# Patient Record
Sex: Female | Born: 1960 | Race: White | Hispanic: No | Marital: Married | Smoking: Never smoker
Health system: Southern US, Community
[De-identification: ages and names within clinical notes are randomized; demographics above are authoritative.]

## PROBLEM LIST (undated history)

## (undated) DIAGNOSIS — K2970 Gastritis, unspecified, without bleeding: Secondary | ICD-10-CM

## (undated) DIAGNOSIS — I5189 Other ill-defined heart diseases: Secondary | ICD-10-CM

## (undated) DIAGNOSIS — R079 Chest pain, unspecified: Secondary | ICD-10-CM

## (undated) DIAGNOSIS — K449 Diaphragmatic hernia without obstruction or gangrene: Secondary | ICD-10-CM

## (undated) DIAGNOSIS — R519 Headache, unspecified: Secondary | ICD-10-CM

## (undated) DIAGNOSIS — I10 Essential (primary) hypertension: Secondary | ICD-10-CM

## (undated) DIAGNOSIS — K317 Polyp of stomach and duodenum: Secondary | ICD-10-CM

## (undated) DIAGNOSIS — N200 Calculus of kidney: Secondary | ICD-10-CM

## (undated) DIAGNOSIS — E785 Hyperlipidemia, unspecified: Secondary | ICD-10-CM

## (undated) DIAGNOSIS — Z87442 Personal history of urinary calculi: Secondary | ICD-10-CM

## (undated) HISTORY — DX: Gastritis, unspecified, without bleeding: K29.70

## (undated) HISTORY — DX: Polyp of stomach and duodenum: K31.7

## (undated) HISTORY — DX: Diaphragmatic hernia without obstruction or gangrene: K44.9

## (undated) HISTORY — DX: Other ill-defined heart diseases: I51.89

## (undated) HISTORY — DX: Chest pain, unspecified: R07.9

## (undated) HISTORY — DX: Essential (primary) hypertension: I10

## (undated) HISTORY — DX: Calculus of kidney: N20.0

## (undated) HISTORY — DX: Hyperlipidemia, unspecified: E78.5

## (undated) HISTORY — PX: FOOT SURGERY: SHX648

## (undated) HISTORY — PX: CARPAL TUNNEL RELEASE: SHX101

---

## 1988-01-09 HISTORY — PX: BACK SURGERY: SHX140

## 2007-01-07 DIAGNOSIS — T8859XA Other complications of anesthesia, initial encounter: Secondary | ICD-10-CM

## 2007-01-07 HISTORY — DX: Other complications of anesthesia, initial encounter: T88.59XA

## 2010-01-06 HISTORY — PX: CHOLECYSTECTOMY: SHX55

## 2018-10-12 ENCOUNTER — Other Ambulatory Visit: Payer: Self-pay | Admitting: Student

## 2018-10-12 DIAGNOSIS — M1711 Unilateral primary osteoarthritis, right knee: Secondary | ICD-10-CM

## 2018-10-22 ENCOUNTER — Ambulatory Visit: Payer: BC Managed Care – PPO

## 2018-10-26 ENCOUNTER — Encounter: Payer: Self-pay | Admitting: Cardiology

## 2018-11-04 ENCOUNTER — Ambulatory Visit: Payer: BC Managed Care – PPO

## 2018-11-15 ENCOUNTER — Ambulatory Visit
Admission: RE | Admit: 2018-11-15 | Discharge: 2018-11-15 | Disposition: A | Discharge status: Home / Self Care | Payer: BC Managed Care – PPO | Source: Ambulatory Visit | Attending: Student | Admitting: Student

## 2018-11-15 ENCOUNTER — Other Ambulatory Visit: Payer: Self-pay

## 2018-11-15 DIAGNOSIS — M1711 Unilateral primary osteoarthritis, right knee: Secondary | ICD-10-CM | POA: Diagnosis present

## 2019-02-18 ENCOUNTER — Encounter: Payer: Self-pay | Admitting: Cardiology

## 2019-02-18 ENCOUNTER — Other Ambulatory Visit: Payer: Self-pay

## 2019-02-18 ENCOUNTER — Ambulatory Visit (INDEPENDENT_AMBULATORY_CARE_PROVIDER_SITE_OTHER): Payer: BC Managed Care – PPO | Admitting: Cardiology

## 2019-02-18 VITALS — BP 150/94 | HR 101 | Ht 65.0 in | Wt 220.2 lb

## 2019-02-18 DIAGNOSIS — R072 Precordial pain: Secondary | ICD-10-CM | POA: Diagnosis not present

## 2019-02-18 DIAGNOSIS — R079 Chest pain, unspecified: Secondary | ICD-10-CM

## 2019-02-18 DIAGNOSIS — I10 Essential (primary) hypertension: Secondary | ICD-10-CM | POA: Diagnosis not present

## 2019-02-18 DIAGNOSIS — E7800 Pure hypercholesterolemia, unspecified: Secondary | ICD-10-CM

## 2019-02-18 MED ORDER — METOPROLOL TARTRATE 100 MG PO TABS: ORAL_TABLET | ORAL | 0 refills | Status: DC

## 2019-02-18 MED ORDER — LOSARTAN POTASSIUM 50 MG PO TABS: 50.0000 mg | ORAL_TABLET | Freq: Every day | ORAL | 5 refills | Status: DC

## 2019-02-18 NOTE — Patient Instructions (Addendum)
Medication Instructions:  Your physician has recommended you make the following change in your medication:   START Losartan 50 mg daily. An Rx has been sent to your pharmacy.  A one time dose of Metoprolol 100mg  to be taken 2 hours prior to your Cardiac CTA has been sent to your pharmacy.  *If you need a refill on your cardiac medications before your next appointment, please call your pharmacy*  Lab Work: You will need lab work (bmet) prior to your Cardiac CTA. Please take the written lab order with you to your pcp's office.  If you have labs (blood work) drawn today and your tests are completely normal, you will receive your results only by: MyChart Message (if you have MyChart) OR . A paper copy in the mail If you have any lab test that is abnormal or we need to change your treatment, we will call you to review the results.  Testing/Procedures: Your physician has requested that you have an echocardiogram. Echocardiography is a painless test that uses sound waves to create images of your heart. It provides your doctor with information about the size and shape of your heart and how well your heart's chambers and valves are working. This procedure takes approximately one hour. There are no restrictions for this procedure.  Your physician has requested that you have cardiac CT. Cardiac computed tomography (CT) is a painless test that uses an x-ray machine to take clear, detailed pictures of your heart. For further information please visit Marland Kitchen. Please follow instruction sheet as given.     Follow-Up: At [facility], you and your health needs are our priority.  As part of our continuing mission to provide you with exceptional heart care, we have created designated Provider Care Teams.  These Care Teams include your primary Cardiologist (physician) and Advanced Practice Providers (APPs -  Physician Assistants and Nurse Practitioners) who all work together to provide you with  the care you need, when you need it.  Your next appointment:   5-6 weeks   The format for your next appointment:   In Person  Provider:    You may see [facility], MD or one of the following Advanced Practice Providers on your designated Care Team:    Debbe Odea, NP  Nicolasa Ducking, PA-C  Eula Listen, PA-C   Other Instructions Your cardiac CT will be scheduled at one of the below locations:   [facility] [address] [place], [place] [phone]  OR  [facility] [address] [place], [place] [phone]  If scheduled at [facility], please arrive at the [facility] main entrance of [facility] 30 minutes prior to test start time. Proceed to the [facility] Radiology Department (first floor) to check-in and test prep.  If scheduled at [facility], please arrive 15 mins early for check-in and test prep.  Please follow these instructions carefully (unless otherwise directed):    On the Night Before the Test: . Be sure to Drink plenty of water. . Do not consume any caffeinated/decaffeinated beverages or chocolate 12 hours prior to your test. . Do not take any antihistamines 12 hours prior to your test.  On the Day of the Test: . Drink plenty of water. Do not drink any water within one hour of the test. . Do not eat any food 4 hours prior to the test. . You may take your regular medications prior  to the test.  . Take metoprolol (Lopressor) two hours prior to test. . FEMALES- please wear underwire-free bra if available      After the Test: . Drink plenty of water. . After receiving IV contrast, you may experience a mild flushed feeling. This is normal. . On occasion, you may experience a mild rash up to 24 hours after the test. This is not dangerous. If this occurs, you can take Benadryl 25 mg and increase your fluid  intake. . If you experience trouble breathing, this can be serious. If it is severe call 911 IMMEDIATELY. If it is mild, please call our office. . If you take any of these medications: Glipizide/Metformin, Avandament, Glucavance, please do not take 48 hours after completing test unless otherwise instructed.   Once we have confirmed authorization from your insurance company, we will call you to set up a date and time for your test.   For non-scheduling related questions, please contact the cardiac imaging nurse navigator should you have any questions/concerns: Marchia Bond, RN Navigator Cardiac Imaging [facility] [phone] mobile

## 2019-02-18 NOTE — Progress Notes (Signed)
Cardiology Office Note:    Date:  02/18/2019   ID:  Claire Miller, DOB [birth year], MRN [id number]  PCP:  Lorelei Pont, DO  Cardiologist:  Debbe Odea, MD  Electrophysiologist:  None   Referring MD: Lorelei Pont, DO   Chief Complaint  Patient presents with  . New Patient (Initial Visit)    Chest discomfort feels like a burning sensation and elevated BP. Meds verbally reviewed w/ patient.   Claire Miller is a 59 y.o. female who is being seen today for the evaluation of chest pain at the request of Lorelei Pont, DO.  History of Present Illness:    Claire Miller is a 59 y.o. female with a hx of hypertension, hyperlipidemia, GERD, osteoarthritis of the right knee who presents due to chest pain.  Patient states having symptoms of chest pain over the past several weeks.  She had a severe episode last evening on and off and did not go to sleep.  She describes symptoms as left-sided chest pressure, which she rates as a 4 out of 10 in severity.  Usually moving around helps with the discomfort but not this time.  Her symptoms of chest discomfort unrelated with exertion.  She also had another episode not as severe as last evening 3 days ago.  She was sitting down when symptoms happen.  She lives about an hour away in [place] and has family living in the area.  She was rushing to get to appointment today which might have caused her heart rate to increase.  She states having elevated blood pressures at home over the past couple of days when she checked.  Systolic is usually in the 140s to 150s.  She denies any history of heart disease, denies presyncope or syncope.  She had a prior episode of edema attributed to amlodipine.  This resolved after stopping amlodipine.  She has a history of GERD and states these symptoms are different compared to reflux symptoms.  Past Medical History:  Diagnosis Date  . Hyperlipidemia   . Hypertension   . Kidney stones     Past Surgical History:  Procedure  Laterality Date  . BACK SURGERY    . CARPAL TUNNEL RELEASE Left   . CHOLECYSTECTOMY    . FOOT SURGERY Bilateral     Current Medications: Current Meds  Medication Sig  . Ascorbic Acid (VITAMIN C PO) Take by mouth daily.  . ASPIRIN 81 PO 81 mg daily.   Marland Kitchen CALCIUM PO Take by mouth daily.  . diazepam (VALIUM) 5 MG tablet Take 5 mg by mouth 2 (two) times daily as needed.  Marland Kitchen ibuprofen (ADVIL) 600 MG tablet Take 600 mg by mouth as needed.   Marland Kitchen MAGNESIUM PO Take by mouth daily.   . meloxicam (MOBIC) 15 MG tablet Take 15 mg by mouth daily as needed.  . metoprolol succinate (TOPROL-XL) 100 MG 24 hr tablet daily.  . Multiple Vitamin (MULTIVITAMIN ADULT PO) daily.  . Omega-3 Fatty Acids (FISH OIL PO) Take by mouth daily.  . Omeprazole 20 MG TBEC 20 mg daily.   . Red Yeast Rice Extract 600 MG CAPS Take by mouth daily.  . rosuvastatin (CRESTOR) 5 MG tablet Take 5 mg by mouth daily.   Marland Kitchen tiZANidine (ZANAFLEX) 2 MG tablet Take 2 mg by mouth every 6 (six) hours as needed.  . TURMERIC PO Take by mouth daily.  Marland Kitchen VITAMIN D PO Take by mouth daily.      Allergies:   Sulfa antibiotics   Social  History   Socioeconomic History  . Marital status: Married    Spouse name: Not on file  . Number of children: Not on file  . Years of education: Not on file  . Highest education level: Not on file  Occupational History  . Not on file  Tobacco Use  . Smoking status: Never Smoker  . Smokeless tobacco: Never Used  Substance and Sexual Activity  . Alcohol use: Never  . Drug use: Never  . Sexual activity: Not on file  Other Topics Concern  . Not on file  Social History Narrative  . Not on file   Social Determinants of Health   Financial Resource Strain:   . Difficulty of Paying Living Expenses: Not on file  Food Insecurity:   . Worried About Programme researcher, broadcasting/film/video in the Last Year: Not on file  . Ran Out of Food in the Last Year: Not on file  Transportation Needs:   . Lack of Transportation (Medical):  Not on file  . Lack of Transportation (Non-Medical): Not on file  Physical Activity:   . Days of Exercise per Week: Not on file  . Minutes of Exercise per Session: Not on file  Stress:   . Feeling of Stress : Not on file  Social Connections:   . Frequency of Communication with Friends and Family: Not on file  . Frequency of Social Gatherings with Friends and Family: Not on file  . Attends Religious Services: Not on file  . Active Member of Clubs or Organizations: Not on file  . Attends Banker Meetings: Not on file  . Marital Status: Not on file     Family History: Atrial fibrillation in the mother.  ROS:   Please see the history of present illness.     All other systems reviewed and are negative.  EKGs/Labs/Other Studies Reviewed:    The following studies were reviewed today:   EKG:  EKG is  ordered today.  The ekg ordered today demonstrates sinus tachycardia, heart rate 101  Recent Labs: No results found for requested labs within last 8760 hours.  Recent Lipid Panel No results found for: CHOL, TRIG, HDL, CHOLHDL, VLDL, LDLCALC, LDLDIRECT  Physical Exam:    VS:  BP (!) 150/94 (BP Location: Right Arm, Patient Position: Sitting, Cuff Size: Large)   Pulse (!) 101   Ht 5\' 5"  (1.651 m)   Wt 220 lb 4 oz (99.9 kg)   SpO2 97%   BMI 36.65 kg/m     Wt Readings from Last 3 Encounters:  02/18/19 220 lb 4 oz (99.9 kg)     GEN:  Well nourished, well developed in no acute distress HEENT: Normal NECK: No JVD; No carotid bruits LYMPHATICS: No lymphadenopathy CARDIAC: RRR, no murmurs, rubs, gallops RESPIRATORY:  Clear to auscultation without rales, wheezing or rhonchi  ABDOMEN: Soft, non-tender, non-distended MUSCULOSKELETAL:  No edema; No deformity  SKIN: Warm and dry NEUROLOGIC:  Alert and oriented x 3 PSYCHIATRIC:  Normal affect   ASSESSMENT:    1. Chest pain of uncertain etiology   2. Essential hypertension   3. Pure hypercholesterolemia   4.  Precordial pain    PLAN:    In order of problems listed above:  1. Patient with persistent atypical chest pain.  She has risk factors of hypertension, hyperlipidemia, obesity.  Will evaluate patient with coronary CT angiogram, echocardiogram.  Also recommended patient increase omeprazole to 20 mg twice daily in case reflux is contributing even though patient  states the symptoms are different compared to her usual reflux symptoms. 2. Blood pressure is not controlled.  Start losartan 50 mg daily.  Continue Toprol-XL 100 mg daily. 3. Patient with history of hyperlipidemia.  Recent lipid panel performed at her primary care provider's office.  We will request these records.  Continue Crestor 5 mg daily.  Low up after echocardiogram, coronary CTA.  This note was generated in part or whole with voice recognition software. Voice recognition is usually quite accurate but there are transcription errors that can and very often do occur. I apologize for any typographical errors that were not detected and corrected.  Medication Adjustments/Labs and Tests Ordered: Current medicines are reviewed at length with the patient today.  Concerns regarding medicines are outlined above.  Orders Placed This Encounter  Procedures  . CT CORONARY MORPH W/CTA COR W/SCORE W/CA W/CM &/OR WO/CM  . CT CORONARY FRACTIONAL FLOW RESERVE DATA PREP  . CT CORONARY FRACTIONAL FLOW RESERVE FLUID ANALYSIS  . Basic metabolic panel  . EKG 12-Lead  . ECHOCARDIOGRAM COMPLETE   Meds ordered this encounter  Medications  . losartan (COZAAR) 50 MG tablet    Sig: Take 1 tablet (50 mg total) by mouth daily.    Dispense:  30 tablet    Refill:  5  . metoprolol tartrate (LOPRESSOR) 100 MG tablet    Sig: Take 1 tablet (100 mg) by mouth 2 hours prior to your Cardiac CTA    Dispense:  1 tablet    Refill:  0    Patient Instructions  Medication Instructions:  Your physician has recommended you make the following change in your  medication:   START Losartan 50 mg daily. An Rx has been sent to your pharmacy.  A one time dose of Metoprolol 100mg  to be taken 2 hours prior to your Cardiac CTA has been sent to your pharmacy.  *If you need a refill on your cardiac medications before your next appointment, please call your pharmacy*  Lab Work: You will need lab work (bmet) prior to your Cardiac CTA. Please take the written lab order with you to your pcp's office.  If you have labs (blood work) drawn today and your tests are completely normal, you will receive your results only by: Marland Kitchen MyChart Message (if you have MyChart) OR . A paper copy in the mail If you have any lab test that is abnormal or we need to change your treatment, we will call you to review the results.  Testing/Procedures: Your physician has requested that you have an echocardiogram. Echocardiography is a painless test that uses sound waves to create images of your heart. It provides your doctor with information about the size and shape of your heart and how well your heart's chambers and valves are working. This procedure takes approximately one hour. There are no restrictions for this procedure.  Your physician has requested that you have cardiac CT. Cardiac computed tomography (CT) is a painless test that uses an x-ray machine to take clear, detailed pictures of your heart. For further information please visit [email or web address]. Please follow instruction sheet as given.     Follow-Up: At [facility], you and your health needs are our priority.  As part of our continuing mission to provide you with exceptional heart care, we have created designated Provider Care Teams.  These Care Teams include your primary Cardiologist (physician) and Advanced Practice Providers (APPs -  Physician Assistants and Nurse Practitioners) who all work together to provide you with the  care you need, when you need it.  Your next appointment:   5-6 weeks   The format for  your next appointment:   In Person  Provider:    You may see Debbe Odea, MD or one of the following Advanced Practice Providers on your designated Care Team:    Nicolasa Ducking, NP  Eula Listen, PA-C  Marisue Ivan, PA-C   Other Instructions Your cardiac CT will be scheduled at one of the below locations:   [facility] [address] [place], [place] [address] [phone]  OR  [facility] [address] [place], [place] [address] [phone]  If scheduled at [facility], please arrive at the [facility] main entrance of [facility] 30 minutes prior to test start time. Proceed to the [facility] Radiology Department (first floor) to check-in and test prep.  If scheduled at [facility], please arrive 15 mins early for check-in and test prep.  Please follow these instructions carefully (unless otherwise directed):    On the Night Before the Test: . Be sure to Drink plenty of water. . Do not consume any caffeinated/decaffeinated beverages or chocolate 12 hours prior to your test. . Do not take any antihistamines 12 hours prior to your test.  On the Day of the Test: . Drink plenty of water. Do not drink any water within one hour of the test. . Do not eat any food 4 hours prior to the test. . You may take your regular medications prior to the test.  . Take metoprolol (Lopressor) two hours prior to test. . FEMALES- please wear underwire-free bra if available      After the Test: . Drink plenty of water. . After receiving IV contrast, you may experience a mild flushed feeling. This is normal. . On occasion, you may experience a mild rash up to 24 hours after the test. This is not dangerous. If this occurs, you can take Benadryl 25 mg and increase your fluid intake. . If you experience trouble breathing, this can be serious. If it is severe call 911 IMMEDIATELY.  If it is mild, please call our office. . If you take any of these medications: Glipizide/Metformin, Avandament, Glucavance, please do not take 48 hours after completing test unless otherwise instructed.   Once we have confirmed authorization from your insurance company, we will call you to set up a date and time for your test.   For non-scheduling related questions, please contact the cardiac imaging nurse navigator should you have any questions/concerns: Rockwell Alexandria, RN Navigator Cardiac Imaging [facility] [phone] mobile       Signed, Debbe Odea, MD  02/18/2019 1:04 PM    [facility]

## 2019-03-18 ENCOUNTER — Encounter: Payer: Self-pay | Admitting: Cardiology

## 2019-03-28 ENCOUNTER — Other Ambulatory Visit: Payer: Self-pay

## 2019-03-28 ENCOUNTER — Ambulatory Visit (INDEPENDENT_AMBULATORY_CARE_PROVIDER_SITE_OTHER): Payer: BC Managed Care – PPO

## 2019-03-28 DIAGNOSIS — R072 Precordial pain: Secondary | ICD-10-CM

## 2019-03-28 DIAGNOSIS — R079 Chest pain, unspecified: Secondary | ICD-10-CM | POA: Diagnosis not present

## 2019-03-30 ENCOUNTER — Encounter (HOSPITAL_COMMUNITY): Payer: Self-pay

## 2019-03-31 ENCOUNTER — Other Ambulatory Visit: Payer: Self-pay

## 2019-03-31 ENCOUNTER — Ambulatory Visit
Admission: RE | Admit: 2019-03-31 | Discharge: 2019-03-31 | Disposition: A | Discharge status: Home / Self Care | Payer: BC Managed Care – PPO | Source: Ambulatory Visit | Attending: Cardiology | Admitting: Cardiology

## 2019-03-31 DIAGNOSIS — R072 Precordial pain: Secondary | ICD-10-CM | POA: Insufficient documentation

## 2019-03-31 MED ORDER — IOHEXOL 350 MG/ML SOLN
125.0000 mL | Freq: Once | INTRAVENOUS | Status: AC | PRN
  Administered 2019-03-31: 125 mL via INTRAVENOUS

## 2019-03-31 MED ORDER — NITROGLYCERIN 0.4 MG SL SUBL
0.8000 mg | SUBLINGUAL_TABLET | Freq: Once | SUBLINGUAL | Status: AC
  Administered 2019-03-31: 0.8 mg via SUBLINGUAL

## 2019-03-31 MED ORDER — METOPROLOL TARTRATE 5 MG/5ML IV SOLN
5.0000 mg | INTRAVENOUS | Status: DC | PRN
  Administered 2019-03-31: 10:00:00 5 mg via INTRAVENOUS

## 2019-03-31 NOTE — Progress Notes (Signed)
Patient tolerated CT well. Drank water after. Ambulated to exit steady gait.  

## 2019-04-06 ENCOUNTER — Telehealth: Payer: Self-pay | Admitting: Cardiology

## 2019-04-06 NOTE — Telephone Encounter (Signed)
    COVID-19 Pre-Screening Questions:  . In the past 7 to 10 days have you had a cough,  shortness of breath, headache, congestion, fever (100 or greater) body aches, chills, sore throat, or sudden loss of taste or sense of smell? YES  . Have you been around anyone with known Covid 19. no . Have you been around anyone who is awaiting Covid 19 test results in the past 7 to 10 days?  No  . Have you been around anyone who has been exposed to Covid 19, or has mentioned symptoms of Covid 19 within the past 7 to 10 days? No   NEGATIVE TEST TODAY RUNNY NOSE SCRATCHY THROAT  If you have any concerns/questions about symptoms patients report during screening (either on the phone or at threshold). Contact the provider seeing the patient or DOD for further guidance.  If neither are available contact a member of the leadership team.

## 2019-04-06 NOTE — Telephone Encounter (Signed)
Patient had covid testing in [place] and she will bring the paper copy with her .

## 2019-04-07 ENCOUNTER — Ambulatory Visit (INDEPENDENT_AMBULATORY_CARE_PROVIDER_SITE_OTHER): Payer: BC Managed Care – PPO | Admitting: Cardiology

## 2019-04-07 ENCOUNTER — Other Ambulatory Visit: Payer: Self-pay

## 2019-04-07 ENCOUNTER — Encounter: Payer: Self-pay | Admitting: Cardiology

## 2019-04-07 VITALS — BP 130/88 | HR 97 | Ht 65.0 in | Wt 220.5 lb

## 2019-04-07 DIAGNOSIS — I10 Essential (primary) hypertension: Secondary | ICD-10-CM

## 2019-04-07 DIAGNOSIS — E7800 Pure hypercholesterolemia, unspecified: Secondary | ICD-10-CM

## 2019-04-07 DIAGNOSIS — R079 Chest pain, unspecified: Secondary | ICD-10-CM

## 2019-04-07 NOTE — Patient Instructions (Signed)
Medication Instructions:  Your physician recommends that you continue on your current medications as directed. Please refer to the Current Medication list given to you today.  *If you need a refill on your cardiac medications before your next appointment, please call your pharmacy*   Follow-Up: At [facility], you and your health needs are our priority.  As part of our continuing mission to provide you with exceptional heart care, we have created designated Provider Care Teams.  These Care Teams include your primary Cardiologist (physician) and Advanced Practice Providers (APPs -  Physician Assistants and Nurse Practitioners) who all work together to provide you with the care you need, when you need it.  We recommend signing up for the patient portal called "MyChart".  Sign up information is provided on this After Visit Summary.  MyChart is used to connect with patients for Virtual Visits (Telemedicine).  Patients are able to view lab/test results, encounter notes, upcoming appointments, etc.  Non-urgent messages can be sent to your provider as well.   To learn more about what you can do with MyChart, go to https://www.mychart.com.    Your next appointment:   12 month(s)  The format for your next appointment:   In Person  Provider:    You may see Brian Agbor-Etang, MD or one of the following Advanced Practice Providers on your designated Care Team:    Christopher Berge, NP  Ryan Dunn, PA-C  Jacquelyn Visser, PA-C  

## 2019-04-07 NOTE — Progress Notes (Signed)
Cardiology Office Note:    Date:  04/07/2019   ID:  Claire Miller, DOB Jan 04, 1961, MRN [id number]  PCP:  Lorelei Pont, DO  Cardiologist:  Debbe Odea, MD  Electrophysiologist:  None   Referring MD: Lorelei Pont, DO   Chief Complaint  Patient presents with  . Other    6 week follow up. Paitent states she feels much better. Meds reviewed verbally with patient.     History of Present Illness:    Claire Miller is a 59 y.o. female with a hx of hypertension, hyperlipidemia, GERD, osteoarthritis of the right knee who presents for follow-up.  She was last seen due to chest pain.  She described a left-sided chest pressure with 4 out of 10 in severity, not related with exertion.  Due to risk factors, coronary CTA was ordered.  Patient now presents for results.   Past Medical History:  Diagnosis Date  . Hyperlipidemia   . Hypertension   . Kidney stones     Past Surgical History:  Procedure Laterality Date  . BACK SURGERY    . CARPAL TUNNEL RELEASE Left   . CHOLECYSTECTOMY    . FOOT SURGERY Bilateral     Current Medications: Current Meds  Medication Sig  . Ascorbic Acid (VITAMIN C PO) Take by mouth daily.  . ASPIRIN 81 PO 81 mg daily.   Marland Kitchen CALCIUM PO Take by mouth daily.  . diazepam (VALIUM) 5 MG tablet Take 5 mg by mouth 2 (two) times daily as needed.  Marland Kitchen ibuprofen (ADVIL) 600 MG tablet Take 600 mg by mouth as needed.   Marland Kitchen losartan (COZAAR) 50 MG tablet Take 1 tablet (50 mg total) by mouth daily.  Marland Kitchen MAGNESIUM PO Take by mouth daily.   . meloxicam (MOBIC) 15 MG tablet Take 15 mg by mouth daily as needed.  . metoprolol succinate (TOPROL-XL) 100 MG 24 hr tablet daily.  . Multiple Vitamin (MULTIVITAMIN ADULT PO) daily.  . Omega-3 Fatty Acids (FISH OIL PO) Take by mouth daily.  . Omeprazole 20 MG TBEC 20 mg daily.   . Red Yeast Rice Extract 600 MG CAPS Take by mouth daily.  . rosuvastatin (CRESTOR) 5 MG tablet Take 5 mg by mouth daily.   Marland Kitchen tiZANidine (ZANAFLEX) 2 MG tablet  Take 2 mg by mouth every 6 (six) hours as needed.  . TURMERIC PO Take by mouth daily.  Marland Kitchen VITAMIN D PO Take by mouth daily.      Allergies:   Sulfa antibiotics   Social History   Socioeconomic History  . Marital status: Married    Spouse name: Not on file  . Number of children: Not on file  . Years of education: Not on file  . Highest education level: Not on file  Occupational History  . Not on file  Tobacco Use  . Smoking status: Never Smoker  . Smokeless tobacco: Never Used  Substance and Sexual Activity  . Alcohol use: Never  . Drug use: Never  . Sexual activity: Not on file  Other Topics Concern  . Not on file  Social History Narrative  . Not on file   Social Determinants of Health   Financial Resource Strain:   . Difficulty of Paying Living Expenses:   Food Insecurity:   . Worried About Programme researcher, broadcasting/film/video in the Last Year:   . Barista in the Last Year:   Transportation Needs:   . Freight forwarder (Medical):   Marland Kitchen Lack of Transportation (Non-Medical):  Physical Activity:   . Days of Exercise per Week:   . Minutes of Exercise per Session:   Stress:   . Feeling of Stress :   Social Connections:   . Frequency of Communication with Friends and Family:   . Frequency of Social Gatherings with Friends and Family:   . Attends Religious Services:   . Active Member of Clubs or Organizations:   . Attends Banker Meetings:   Marland Kitchen Marital Status:      Family History: Atrial fibrillation in the mother.  ROS:   Please see the history of present illness.     All other systems reviewed and are negative.  EKGs/Labs/Other Studies Reviewed:    The following studies were reviewed today:  EKG: EKG not obtained today  Recent Labs: No results found for requested labs within last 8760 hours.  Recent Lipid Panel No results found for: CHOL, TRIG, HDL, CHOLHDL, VLDL, LDLCALC, LDLDIRECT  Physical Exam:    VS:  BP 130/88 (BP Location: Left Arm,  Patient Position: Sitting, Cuff Size: Large)   Pulse 97   Ht 5\' 5"  (1.651 m)   Wt 220 lb 8 oz (100 kg)   SpO2 97%   BMI 36.69 kg/m     Wt Readings from Last 3 Encounters:  04/07/19 220 lb 8 oz (100 kg)  02/18/19 220 lb 4 oz (99.9 kg)     GEN:  Well nourished, well developed in no acute distress HEENT: Normal NECK: No JVD; No carotid bruits LYMPHATICS: No lymphadenopathy CARDIAC: RRR, no murmurs, rubs, gallops RESPIRATORY:  Clear to auscultation without rales, wheezing or rhonchi  ABDOMEN: Soft, non-tender, non-distended MUSCULOSKELETAL:  No edema; No deformity  SKIN: Warm and dry NEUROLOGIC:  Alert and oriented x 3 PSYCHIATRIC:  Normal affect   ASSESSMENT:    1. Chest pain of uncertain etiology   2. Essential hypertension   3. Pure hypercholesterolemia    PLAN:    In order of problems listed above:  1. Patient with persistent atypical chest pain.  She has risk factors of hypertension, hyperlipidemia, obesity.  Echocardiogram showed normal systolic function with impaired relaxation, ejection fraction 60 to 65%.  Coronary CTA showed a calcium score of 13.7, minimal calcification in the left main, nonobstructive CAD.  Patient reassured  2. blood pressure better controlled today.  Continue losartan 40 daily, Toprol-XL 10 mg daily. 3. History of hyperlipidemia.  Continue statin as prescribed  Follow-up in 1 year.  This note was generated in part or whole with voice recognition software. Voice recognition is usually quite accurate but there are transcription errors that can and very often do occur. I apologize for any typographical errors that were not detected and corrected.  Medication Adjustments/Labs and Tests Ordered: Current medicines are reviewed at length with the patient today.  Concerns regarding medicines are outlined above.  Orders Placed This Encounter  Procedures  . EKG 12-Lead   No orders of the defined types were placed in this encounter.   Patient  Instructions  Medication Instructions:  Your physician recommends that you continue on your current medications as directed. Please refer to the Current Medication list given to you today.  *If you need a refill on your cardiac medications before your next appointment, please call your pharmacy*   Follow-Up: At [facility], you and your health needs are our priority.  As part of our continuing mission to provide you with exceptional heart care, we have created designated Provider Care Teams.  These Care  Teams include your primary Cardiologist (physician) and Advanced Practice Providers (APPs -  Physician Assistants and Nurse Practitioners) who all work together to provide you with the care you need, when you need it.  We recommend signing up for the patient portal called "MyChart".  Sign up information is provided on this After Visit Summary.  MyChart is used to connect with patients for Virtual Visits (Telemedicine).  Patients are able to view lab/test results, encounter notes, upcoming appointments, etc.  Non-urgent messages can be sent to your provider as well.   To learn more about what you can do with MyChart, go to [email or web address].    Your next appointment:   12 month(s)  The format for your next appointment:   In Person  Provider:    You may see Kate Sable, MD or one of the following Advanced Practice Providers on your designated Care Team:    Murray Hodgkins, NP  Christell Faith, PA-C  Marrianne Mood, PA-C     Signed, Kate Sable, MD  04/07/2019 12:20 PM    [place]

## 2019-05-23 ENCOUNTER — Other Ambulatory Visit: Payer: Self-pay | Admitting: Surgery

## 2019-06-03 ENCOUNTER — Other Ambulatory Visit: Payer: Self-pay

## 2019-06-03 ENCOUNTER — Encounter
Admission: RE | Admit: 2019-06-03 | Discharge: 2019-06-03 | Disposition: A | Discharge status: Home / Self Care | Payer: BC Managed Care – PPO | Source: Ambulatory Visit | Attending: Surgery | Admitting: Surgery

## 2019-06-03 DIAGNOSIS — Z01812 Encounter for preprocedural laboratory examination: Secondary | ICD-10-CM | POA: Diagnosis present

## 2019-06-03 HISTORY — DX: Headache, unspecified: R51.9

## 2019-06-03 HISTORY — DX: Personal history of urinary calculi: Z87.442

## 2019-06-03 NOTE — Patient Instructions (Addendum)
Your procedure is scheduled on: Thursday June 16, 2019 Report to Day Surgery. To find out your arrival time please call [phone] between 1PM - 3PM on Wednesday June 15, 2019.  Remember: Instructions that are not followed completely may result in serious medical risk,  up to and including death, or upon the discretion of your surgeon and anesthesiologist your  surgery may need to be rescheduled.     _X__ 1. Do not eat food after midnight the night before your procedure.                 No gum chewing or hard candies. You may drink clear liquids up to 2 hours                 before you are scheduled to arrive for your surgery- DO not drink clear                 liquids within 2 hours of the start of your surgery.                 Clear Liquids include:  water, apple juice without pulp, clear Gatorade, G2 or                  Gatorade Zero (avoid Red/Purple/Blue), Black Coffee or Tea (Do not add                 anything to coffee or tea).  __X__2.   Complete the carbohydrate drink provided to you, 2 hours before arrival.  __X__3.  On the morning of surgery brush your teeth with toothpaste and water, you                may rinse your mouth with mouthwash if you wish.  Do not swallow any toothpaste of mouthwash.     _X__ 4.  No Alcohol for 24 hours before or after surgery.   _X__ 5.  Do Not Smoke or use e-cigarettes For 24 Hours Prior to Your Surgery.                 Do not use any chewable tobacco products for at least 6 hours prior to                 Surgery.  _X__  6.  Do not use any recreational drugs (marijuana, cocaine, heroin, ecstacy, MDMA or other)                For at least one week prior to your surgery.  Combination of these drugs with anesthesia                May have life threatening results.  __X__ 7.  Notify your doctor if there is any change in your medical condition      (cold, fever, infections).     Do not wear jewelry, make-up, hairpins,  clips or nail polish. Do not wear lotions, powders, or perfumes. You may wear deodorant. Do not shave 48 hours prior to surgery. Men may shave face and neck. Do not bring valuables to the hospital.    [facility] is not responsible for any belongings or valuables.  Contacts, dentures or bridgework may not be worn into surgery. Leave your suitcase in the car. After surgery it may be brought to your room. For patients admitted to the hospital, discharge time is determined by your treatment team.   Patients discharged the day of surgery will not be allowed to  drive home.   Make arrangements for someone to be with you for the first 24 hours of your Same Day Discharge.    Please read over the following fact sheets that you were given:   Total Joint Packet    __X__ Take these medicines the morning of surgery with A SIP OF WATER:    1. omeprazole (PRILOSEC) 20 MG   __X__ Use CHG Soap (or wipes) as directed   __X__ Stop aspirin on 06/06/19  __X__ Stop Anti-inflammatories such as Ibuprofen, Aleve, naproxen, and or BC powders 06/06/19   __X__ Stop supplements until after surgery.  TURMERIC,Red Yeast Rice Extract, FISH OIL, Biotin, VITAMIN C  __X__ Do not start any herbal supplements before your surgery.

## 2019-06-08 ENCOUNTER — Other Ambulatory Visit: Payer: Self-pay

## 2019-06-08 ENCOUNTER — Encounter
Admission: RE | Admit: 2019-06-08 | Discharge: 2019-06-08 | Disposition: A | Discharge status: Home / Self Care | Payer: BC Managed Care – PPO | Source: Ambulatory Visit | Attending: Surgery | Admitting: Surgery

## 2019-06-08 DIAGNOSIS — Z01812 Encounter for preprocedural laboratory examination: Secondary | ICD-10-CM | POA: Diagnosis present

## 2019-06-08 LAB — CBC WITH DIFFERENTIAL/PLATELET
Abs Immature Granulocytes: 0.01 10*3/uL (ref 0.00–0.07)
Basophils Absolute: 0 10*3/uL (ref 0.0–0.1)
Basophils Relative: 0 %
Eosinophils Absolute: 0.1 10*3/uL (ref 0.0–0.5)
Eosinophils Relative: 1 %
HCT: 41.1 % (ref 36.0–46.0)
Hemoglobin: 14.1 g/dL (ref 12.0–15.0)
Immature Granulocytes: 0 %
Lymphocytes Relative: 28 %
Lymphs Abs: 1.7 10*3/uL (ref 0.7–4.0)
MCH: 31.2 pg (ref 26.0–34.0)
MCHC: 34.3 g/dL (ref 30.0–36.0)
MCV: 90.9 fL (ref 80.0–100.0)
Monocytes Absolute: 0.4 10*3/uL (ref 0.1–1.0)
Monocytes Relative: 7 %
Neutro Abs: 3.9 10*3/uL (ref 1.7–7.7)
Neutrophils Relative %: 64 %
Platelets: 220 10*3/uL (ref 150–400)
RBC: 4.52 MIL/uL (ref 3.87–5.11)
RDW: 12.3 % (ref 11.5–15.5)
WBC: 6.1 10*3/uL (ref 4.0–10.5)
nRBC: 0 % (ref 0.0–0.2)

## 2019-06-08 LAB — URINALYSIS, ROUTINE W REFLEX MICROSCOPIC
Bilirubin Urine: NEGATIVE
Glucose, UA: NEGATIVE mg/dL
Hgb urine dipstick: NEGATIVE
Ketones, ur: NEGATIVE mg/dL
Leukocytes,Ua: NEGATIVE
Nitrite: NEGATIVE
Protein, ur: NEGATIVE mg/dL
Specific Gravity, Urine: 1.025 (ref 1.005–1.030)
pH: 5 (ref 5.0–8.0)

## 2019-06-08 LAB — COMPREHENSIVE METABOLIC PANEL
ALT: 23 U/L (ref 0–44)
AST: 21 U/L (ref 15–41)
Albumin: 4.4 g/dL (ref 3.5–5.0)
Alkaline Phosphatase: 43 U/L (ref 38–126)
Anion gap: 7 (ref 5–15)
BUN: 18 mg/dL (ref 6–20)
CO2: 26 mmol/L (ref 22–32)
Calcium: 9.3 mg/dL (ref 8.9–10.3)
Chloride: 108 mmol/L (ref 98–111)
Creatinine, Ser: 0.83 mg/dL (ref 0.44–1.00)
GFR calc Af Amer: >60 mL/min (ref >60)
GFR calc non Af Amer: >60 mL/min (ref >60)
Glucose, Bld: 95 mg/dL (ref 70–99)
Potassium: 4.3 mmol/L (ref 3.5–5.1)
Sodium: 141 mmol/L (ref 135–145)
Total Bilirubin: 0.8 mg/dL (ref 0.3–1.2)
Total Protein: 7.1 g/dL (ref 6.5–8.1)

## 2019-06-08 LAB — TYPE AND SCREEN
ABO/RH(D): A POS
Antibody Screen: NEGATIVE

## 2019-06-08 LAB — SURGICAL PCR SCREEN
MRSA, PCR: NEGATIVE
Staphylococcus aureus: NEGATIVE

## 2019-06-14 ENCOUNTER — Other Ambulatory Visit: Payer: Self-pay

## 2019-06-14 ENCOUNTER — Other Ambulatory Visit: Admission: RE | Admit: 2019-06-14 | Payer: BC Managed Care – PPO | Source: Ambulatory Visit

## 2019-06-14 ENCOUNTER — Encounter
Admission: RE | Admit: 2019-06-14 | Discharge: 2019-06-14 | Disposition: A | Discharge status: Home / Self Care | Payer: BC Managed Care – PPO | Source: Ambulatory Visit | Attending: Surgery | Admitting: Surgery

## 2019-06-14 DIAGNOSIS — Z20822 Contact with and (suspected) exposure to covid-19: Secondary | ICD-10-CM | POA: Insufficient documentation

## 2019-06-14 DIAGNOSIS — Z01818 Encounter for other preprocedural examination: Secondary | ICD-10-CM | POA: Diagnosis present

## 2019-06-15 LAB — SARS CORONAVIRUS 2 (TAT 6-24 HRS): SARS Coronavirus 2: NEGATIVE

## 2019-06-16 ENCOUNTER — Encounter: Payer: Self-pay | Admitting: Surgery

## 2019-06-16 ENCOUNTER — Ambulatory Visit: Payer: BC Managed Care – PPO | Admitting: Anesthesiology

## 2019-06-16 ENCOUNTER — Ambulatory Visit
Admission: RE | Admit: 2019-06-16 | Discharge: 2019-06-17 | Disposition: A | Discharge status: Home / Self Care | Payer: BC Managed Care – PPO | Source: Ambulatory Visit | Attending: Surgery | Admitting: Surgery

## 2019-06-16 ENCOUNTER — Ambulatory Visit: Payer: BC Managed Care – PPO

## 2019-06-16 ENCOUNTER — Other Ambulatory Visit: Payer: Self-pay

## 2019-06-16 ENCOUNTER — Encounter
Admission: RE | Disposition: A | Discharge status: Home / Self Care | Payer: Self-pay | Source: Ambulatory Visit | Attending: Surgery

## 2019-06-16 DIAGNOSIS — Z7982 Long term (current) use of aspirin: Secondary | ICD-10-CM | POA: Diagnosis not present

## 2019-06-16 DIAGNOSIS — Z882 Allergy status to sulfonamides status: Secondary | ICD-10-CM | POA: Diagnosis not present

## 2019-06-16 DIAGNOSIS — I10 Essential (primary) hypertension: Secondary | ICD-10-CM | POA: Diagnosis not present

## 2019-06-16 DIAGNOSIS — Z79899 Other long term (current) drug therapy: Secondary | ICD-10-CM | POA: Insufficient documentation

## 2019-06-16 DIAGNOSIS — E785 Hyperlipidemia, unspecified: Secondary | ICD-10-CM | POA: Diagnosis not present

## 2019-06-16 DIAGNOSIS — Z96651 Presence of right artificial knee joint: Secondary | ICD-10-CM

## 2019-06-16 DIAGNOSIS — K219 Gastro-esophageal reflux disease without esophagitis: Secondary | ICD-10-CM | POA: Insufficient documentation

## 2019-06-16 DIAGNOSIS — Z96652 Presence of left artificial knee joint: Secondary | ICD-10-CM

## 2019-06-16 DIAGNOSIS — M1711 Unilateral primary osteoarthritis, right knee: Secondary | ICD-10-CM | POA: Insufficient documentation

## 2019-06-16 HISTORY — PX: TOTAL KNEE ARTHROPLASTY: SHX125

## 2019-06-16 LAB — CREATININE, SERUM
Creatinine, Ser: 0.63 mg/dL (ref 0.44–1.00)
GFR calc Af Amer: >60 mL/min (ref >60)
GFR calc non Af Amer: >60 mL/min (ref >60)

## 2019-06-16 LAB — ABO/RH: ABO/RH(D): A POS

## 2019-06-16 SURGERY — ARTHROPLASTY, KNEE, TOTAL
Anesthesia: Spinal | Site: Knee | Laterality: Right

## 2019-06-16 MED ORDER — DEXAMETHASONE SODIUM PHOSPHATE 10 MG/ML IJ SOLN
INTRAMUSCULAR | Status: AC
  Filled 2019-06-16: qty 1

## 2019-06-16 MED ORDER — CEFAZOLIN SODIUM-DEXTROSE 2-4 GM/100ML-% IV SOLN
2.0000 g | Freq: Four times a day (QID) | INTRAVENOUS | Status: AC
  Administered 2019-06-16 – 2019-06-17 (×3): 2 g via INTRAVENOUS
  Filled 2019-06-16 (×3): qty 100

## 2019-06-16 MED ORDER — BISACODYL 10 MG RE SUPP
10.0000 mg | Freq: Every day | RECTAL | Status: DC | PRN
  Administered 2019-06-17: 10 mg via RECTAL
  Filled 2019-06-16: qty 1

## 2019-06-16 MED ORDER — TRANEXAMIC ACID 1000 MG/10ML IV SOLN
INTRAVENOUS | Status: AC
  Filled 2019-06-16: qty 10

## 2019-06-16 MED ORDER — OMEGA-3-ACID ETHYL ESTERS 1 G PO CAPS
1000.0000 mg | ORAL_CAPSULE | Freq: Every day | ORAL | Status: DC
  Administered 2019-06-16: 1000 mg via ORAL
  Filled 2019-06-16: qty 1

## 2019-06-16 MED ORDER — ACETAMINOPHEN 10 MG/ML IV SOLN
INTRAVENOUS | Status: DC | PRN
  Administered 2019-06-16: 1000 mg via INTRAVENOUS

## 2019-06-16 MED ORDER — CEFAZOLIN SODIUM-DEXTROSE 2-4 GM/100ML-% IV SOLN
2.0000 g | INTRAVENOUS | Status: AC
  Administered 2019-06-16: 2 g via INTRAVENOUS

## 2019-06-16 MED ORDER — METOCLOPRAMIDE HCL 5 MG/ML IJ SOLN: 5.0000 mg | Freq: Three times a day (TID) | INTRAMUSCULAR | Status: DC | PRN

## 2019-06-16 MED ORDER — DOCUSATE SODIUM 100 MG PO CAPS
100.0000 mg | ORAL_CAPSULE | Freq: Two times a day (BID) | ORAL | Status: DC
  Administered 2019-06-16 – 2019-06-17 (×2): 100 mg via ORAL
  Filled 2019-06-16 (×2): qty 1

## 2019-06-16 MED ORDER — TRANEXAMIC ACID 1000 MG/10ML IV SOLN
INTRAVENOUS | Status: DC | PRN
  Administered 2019-06-16: 1000 mg via TOPICAL

## 2019-06-16 MED ORDER — DIPHENHYDRAMINE HCL 12.5 MG/5ML PO ELIX: 12.5000 mg | ORAL_SOLUTION | ORAL | Status: DC | PRN

## 2019-06-16 MED ORDER — KETOROLAC TROMETHAMINE 15 MG/ML IJ SOLN
15.0000 mg | Freq: Four times a day (QID) | INTRAMUSCULAR | Status: AC
  Administered 2019-06-16 – 2019-06-17 (×4): 15 mg via INTRAVENOUS
  Filled 2019-06-16 (×4): qty 1

## 2019-06-16 MED ORDER — TRAMADOL HCL 50 MG PO TABS
50.0000 mg | ORAL_TABLET | Freq: Four times a day (QID) | ORAL | Status: DC | PRN
  Administered 2019-06-17: 50 mg via ORAL
  Filled 2019-06-16: qty 1

## 2019-06-16 MED ORDER — DEXAMETHASONE SODIUM PHOSPHATE 10 MG/ML IJ SOLN
INTRAMUSCULAR | Status: DC | PRN
  Administered 2019-06-16: 5 mg via INTRAVENOUS

## 2019-06-16 MED ORDER — CEFAZOLIN SODIUM-DEXTROSE 2-4 GM/100ML-% IV SOLN
INTRAVENOUS | Status: AC
  Filled 2019-06-16: qty 100

## 2019-06-16 MED ORDER — MEPERIDINE HCL 50 MG/ML IJ SOLN: 6.2500 mg | INTRAMUSCULAR | Status: DC | PRN

## 2019-06-16 MED ORDER — LOSARTAN POTASSIUM 50 MG PO TABS
50.0000 mg | ORAL_TABLET | Freq: Every day | ORAL | Status: DC
  Administered 2019-06-16: 50 mg via ORAL
  Filled 2019-06-16: qty 1

## 2019-06-16 MED ORDER — FENTANYL CITRATE (PF) 100 MCG/2ML IJ SOLN
INTRAMUSCULAR | Status: AC
  Filled 2019-06-16: qty 2

## 2019-06-16 MED ORDER — PANTOPRAZOLE SODIUM 40 MG PO TBEC
40.0000 mg | DELAYED_RELEASE_TABLET | Freq: Every day | ORAL | Status: DC
  Administered 2019-06-17: 40 mg via ORAL
  Filled 2019-06-16: qty 1

## 2019-06-16 MED ORDER — ACETAMINOPHEN 325 MG PO TABS
325.0000 mg | ORAL_TABLET | Freq: Four times a day (QID) | ORAL | Status: DC | PRN
  Administered 2019-06-17: 650 mg via ORAL
  Filled 2019-06-16: qty 2

## 2019-06-16 MED ORDER — ONDANSETRON HCL 4 MG/2ML IJ SOLN
INTRAMUSCULAR | Status: DC | PRN
  Administered 2019-06-16: 4 mg via INTRAVENOUS

## 2019-06-16 MED ORDER — ACETAMINOPHEN 325 MG PO TABS: 325.0000 mg | ORAL_TABLET | ORAL | Status: DC | PRN

## 2019-06-16 MED ORDER — BUPIVACAINE HCL (PF) 0.5 % IJ SOLN
INTRAMUSCULAR | Status: AC
  Filled 2019-06-16: qty 10

## 2019-06-16 MED ORDER — OXYCODONE HCL 5 MG PO TABS
5.0000 mg | ORAL_TABLET | ORAL | Status: DC | PRN
  Administered 2019-06-16 – 2019-06-17 (×4): 10 mg via ORAL
  Administered 2019-06-17: 5 mg via ORAL
  Filled 2019-06-16 (×4): qty 2
  Filled 2019-06-16: qty 1

## 2019-06-16 MED ORDER — ONDANSETRON HCL 4 MG/2ML IJ SOLN: 4.0000 mg | Freq: Four times a day (QID) | INTRAMUSCULAR | Status: DC | PRN

## 2019-06-16 MED ORDER — PROPOFOL 10 MG/ML IV BOLUS
INTRAVENOUS | Status: AC
  Filled 2019-06-16: qty 20

## 2019-06-16 MED ORDER — BUPIVACAINE HCL (PF) 0.5 % IJ SOLN
INTRAMUSCULAR | Status: DC | PRN
  Administered 2019-06-16: 2.8 mL

## 2019-06-16 MED ORDER — HYDROCODONE-ACETAMINOPHEN 7.5-325 MG PO TABS: 1.0000 | ORAL_TABLET | Freq: Once | ORAL | Status: DC | PRN

## 2019-06-16 MED ORDER — ROSUVASTATIN CALCIUM 5 MG PO TABS
5.0000 mg | ORAL_TABLET | Freq: Every day | ORAL | Status: DC
  Administered 2019-06-16: 5 mg via ORAL
  Filled 2019-06-16: qty 1

## 2019-06-16 MED ORDER — FENTANYL CITRATE (PF) 100 MCG/2ML IJ SOLN
INTRAMUSCULAR | Status: DC | PRN
  Administered 2019-06-16 (×2): 50 ug via INTRAVENOUS

## 2019-06-16 MED ORDER — RED YEAST RICE EXTRACT 600 MG PO CAPS: 600.0000 mg | ORAL_CAPSULE | Freq: Every day | ORAL | Status: DC

## 2019-06-16 MED ORDER — MIDAZOLAM HCL 2 MG/2ML IJ SOLN
INTRAMUSCULAR | Status: DC | PRN
  Administered 2019-06-16 (×2): 2 mg via INTRAVENOUS

## 2019-06-16 MED ORDER — ACETAMINOPHEN 160 MG/5ML PO SOLN
325.0000 mg | ORAL | Status: DC | PRN
  Filled 2019-06-16: qty 20.3

## 2019-06-16 MED ORDER — HYDROMORPHONE HCL 1 MG/ML IJ SOLN
0.2500 mg | INTRAMUSCULAR | Status: DC | PRN
  Administered 2019-06-16: 0.5 mg via INTRAVENOUS
  Filled 2019-06-16: qty 1

## 2019-06-16 MED ORDER — BUPIVACAINE LIPOSOME 1.3 % IJ SUSP
INTRAMUSCULAR | Status: AC
  Filled 2019-06-16: qty 20

## 2019-06-16 MED ORDER — PROPOFOL 500 MG/50ML IV EMUL
INTRAVENOUS | Status: AC
  Filled 2019-06-16: qty 50

## 2019-06-16 MED ORDER — ONDANSETRON HCL 4 MG PO TABS
4.0000 mg | ORAL_TABLET | Freq: Four times a day (QID) | ORAL | Status: DC | PRN
  Administered 2019-06-16: 4 mg via ORAL
  Filled 2019-06-16: qty 1

## 2019-06-16 MED ORDER — MIDAZOLAM HCL 2 MG/2ML IJ SOLN
INTRAMUSCULAR | Status: AC
  Filled 2019-06-16: qty 2

## 2019-06-16 MED ORDER — BIOTIN 5000 MCG PO TABS: ORAL_TABLET | Freq: Every day | ORAL | Status: DC

## 2019-06-16 MED ORDER — VITAMIN D 25 MCG (1000 UNIT) PO TABS
1000.0000 [IU] | ORAL_TABLET | Freq: Every day | ORAL | Status: DC
  Administered 2019-06-16: 1000 [IU] via ORAL
  Filled 2019-06-16: qty 1

## 2019-06-16 MED ORDER — ASPIRIN EC 81 MG PO TBEC
81.0000 mg | DELAYED_RELEASE_TABLET | Freq: Every day | ORAL | Status: DC
  Administered 2019-06-16: 81 mg via ORAL
  Filled 2019-06-16: qty 1

## 2019-06-16 MED ORDER — SODIUM CHLORIDE 0.9 % IV SOLN
INTRAVENOUS | Status: DC | PRN
  Administered 2019-06-16: 60 mL

## 2019-06-16 MED ORDER — ORAL CARE MOUTH RINSE: 15.0000 mL | Freq: Once | OROMUCOSAL | Status: AC

## 2019-06-16 MED ORDER — PROPOFOL 500 MG/50ML IV EMUL
INTRAVENOUS | Status: DC | PRN
  Administered 2019-06-16: 100 ug/kg/min via INTRAVENOUS

## 2019-06-16 MED ORDER — ENOXAPARIN SODIUM 40 MG/0.4ML ~~LOC~~ SOLN
40.0000 mg | SUBCUTANEOUS | Status: DC
  Administered 2019-06-17: 40 mg via SUBCUTANEOUS
  Filled 2019-06-16: qty 0.4

## 2019-06-16 MED ORDER — KETOROLAC TROMETHAMINE 30 MG/ML IJ SOLN: 30.0000 mg | Freq: Once | INTRAMUSCULAR | Status: AC

## 2019-06-16 MED ORDER — ACETAMINOPHEN 500 MG PO TABS
1000.0000 mg | ORAL_TABLET | Freq: Four times a day (QID) | ORAL | Status: AC
  Administered 2019-06-16 (×3): 1000 mg via ORAL
  Filled 2019-06-16 (×4): qty 2

## 2019-06-16 MED ORDER — CHLORHEXIDINE GLUCONATE 0.12 % MT SOLN
OROMUCOSAL | Status: AC
  Administered 2019-06-16: 15 mL via OROMUCOSAL
  Filled 2019-06-16: qty 15

## 2019-06-16 MED ORDER — PROMETHAZINE HCL 25 MG/ML IJ SOLN: 6.2500 mg | INTRAMUSCULAR | Status: DC | PRN

## 2019-06-16 MED ORDER — SODIUM CHLORIDE (PF) 0.9 % IJ SOLN
INTRAMUSCULAR | Status: AC
  Filled 2019-06-16: qty 50

## 2019-06-16 MED ORDER — CALCIUM CARBONATE-VITAMIN D 500-200 MG-UNIT PO TABS
ORAL_TABLET | Freq: Every day | ORAL | Status: DC
  Administered 2019-06-16: 1 via ORAL
  Filled 2019-06-16: qty 1

## 2019-06-16 MED ORDER — PHENYLEPHRINE HCL-NACL 20-0.9 MG/250ML-% IV SOLN
INTRAVENOUS | Status: DC | PRN
  Administered 2019-06-16: 50 ug/min via INTRAVENOUS

## 2019-06-16 MED ORDER — KETOROLAC TROMETHAMINE 30 MG/ML IJ SOLN
INTRAMUSCULAR | Status: AC
  Administered 2019-06-16: 30 mg via INTRAVENOUS
  Filled 2019-06-16: qty 1

## 2019-06-16 MED ORDER — TURMERIC 500 MG PO TABS: 1650.0000 mg | ORAL_TABLET | Freq: Every day | ORAL | Status: DC

## 2019-06-16 MED ORDER — MAGNESIUM OXIDE 400 (241.3 MG) MG PO TABS
400.0000 mg | ORAL_TABLET | Freq: Every day | ORAL | Status: DC
  Administered 2019-06-16: 400 mg via ORAL
  Filled 2019-06-16: qty 1

## 2019-06-16 MED ORDER — FLEET ENEMA 7-19 GM/118ML RE ENEM: 1.0000 | ENEMA | Freq: Once | RECTAL | Status: DC | PRN

## 2019-06-16 MED ORDER — METOCLOPRAMIDE HCL 10 MG PO TABS: 5.0000 mg | ORAL_TABLET | Freq: Three times a day (TID) | ORAL | Status: DC | PRN

## 2019-06-16 MED ORDER — PHENYLEPHRINE HCL (PRESSORS) 10 MG/ML IV SOLN
INTRAVENOUS | Status: AC
  Filled 2019-06-16: qty 1

## 2019-06-16 MED ORDER — ACETAMINOPHEN 10 MG/ML IV SOLN
INTRAVENOUS | Status: AC
  Filled 2019-06-16: qty 100

## 2019-06-16 MED ORDER — FENTANYL CITRATE (PF) 100 MCG/2ML IJ SOLN: 25.0000 ug | INTRAMUSCULAR | Status: DC | PRN

## 2019-06-16 MED ORDER — BUPIVACAINE-EPINEPHRINE (PF) 0.5% -1:200000 IJ SOLN
INTRAMUSCULAR | Status: AC
  Filled 2019-06-16: qty 30

## 2019-06-16 MED ORDER — ASCORBIC ACID 500 MG PO TABS
250.0000 mg | ORAL_TABLET | Freq: Every day | ORAL | Status: DC
  Administered 2019-06-16: 250 mg via ORAL
  Filled 2019-06-16: qty 1

## 2019-06-16 MED ORDER — LACTATED RINGERS IV SOLN: INTRAVENOUS | Status: DC

## 2019-06-16 MED ORDER — EPHEDRINE 5 MG/ML INJ
INTRAVENOUS | Status: AC
  Filled 2019-06-16: qty 10

## 2019-06-16 MED ORDER — ADULT MULTIVITAMIN W/MINERALS CH
1.0000 | ORAL_TABLET | Freq: Every day | ORAL | Status: DC
  Administered 2019-06-16: 1 via ORAL
  Filled 2019-06-16: qty 1

## 2019-06-16 MED ORDER — ONDANSETRON HCL 4 MG/2ML IJ SOLN
INTRAMUSCULAR | Status: AC
  Filled 2019-06-16: qty 2

## 2019-06-16 MED ORDER — BUPIVACAINE-EPINEPHRINE (PF) 0.5% -1:200000 IJ SOLN
INTRAMUSCULAR | Status: DC | PRN
  Administered 2019-06-16: 30 mL

## 2019-06-16 MED ORDER — METOPROLOL SUCCINATE ER 25 MG PO TB24
100.0000 mg | ORAL_TABLET | Freq: Every day | ORAL | Status: DC
  Administered 2019-06-16: 100 mg via ORAL
  Filled 2019-06-16: qty 4

## 2019-06-16 MED ORDER — MAGNESIUM HYDROXIDE 400 MG/5ML PO SUSP
30.0000 mL | Freq: Every day | ORAL | Status: DC | PRN
  Administered 2019-06-17: 30 mL via ORAL
  Filled 2019-06-16: qty 30

## 2019-06-16 MED ORDER — SODIUM CHLORIDE 0.9 % IV SOLN: INTRAVENOUS | Status: DC

## 2019-06-16 MED ORDER — CHLORHEXIDINE GLUCONATE 0.12 % MT SOLN: 15.0000 mL | Freq: Once | OROMUCOSAL | Status: AC

## 2019-06-16 SURGICAL SUPPLY — 59 items
BLADE SAW SAG 25X90X1.19 (BLADE) ×3 IMPLANT
BLADE SURG SZ20 CARB STEEL (BLADE) ×3 IMPLANT
BNDG ELASTIC 6X5.8 VLCR NS LF (GAUZE/BANDAGES/DRESSINGS) ×3 IMPLANT
CANISTER SUCT 1200ML W/VALVE (MISCELLANEOUS) ×3 IMPLANT
CANISTER SUCT 3000ML PPV (MISCELLANEOUS) ×3 IMPLANT
CEMENT BONE R 1X40 (Cement) ×6 IMPLANT
CEMENT VACUUM MIXING SYSTEM (MISCELLANEOUS) ×3 IMPLANT
CHLORAPREP W/TINT 26 (MISCELLANEOUS) ×3 IMPLANT
COOLER POLAR GLACIER W/PUMP (MISCELLANEOUS) ×3 IMPLANT
COVER MAYO STAND REUSABLE (DRAPES) ×3 IMPLANT
COVER WAND RF STERILE (DRAPES) ×3 IMPLANT
CUFF TOURN SGL QUICK 24 (TOURNIQUET CUFF)
CUFF TOURN SGL QUICK 30 (TOURNIQUET CUFF) ×2
CUFF TRNQT CYL 24X4X16.5-23 (TOURNIQUET CUFF) IMPLANT
CUFF TRNQT CYL 30X4X21-28X (TOURNIQUET CUFF) ×1 IMPLANT
DRAPE 3/4 80X56 (DRAPES) ×3 IMPLANT
DRAPE IMP U-DRAPE 54X76 (DRAPES) ×3 IMPLANT
DRSG OPSITE POSTOP 4X10 (GAUZE/BANDAGES/DRESSINGS) ×3 IMPLANT
DRSG OPSITE POSTOP 4X8 (GAUZE/BANDAGES/DRESSINGS) ×3 IMPLANT
ELECT CAUTERY BLADE 6.4 (BLADE) ×3 IMPLANT
ELECT REM PT RETURN 9FT ADLT (ELECTROSURGICAL) ×3
ELECTRODE REM PT RTRN 9FT ADLT (ELECTROSURGICAL) ×1 IMPLANT
FEMORAL CR RIGHT 67.5MM (Joint) ×3 IMPLANT
GLOVE BIO SURGEON STRL SZ7.5 (GLOVE) ×18 IMPLANT
GLOVE BIO SURGEON STRL SZ8 (GLOVE) ×12 IMPLANT
GLOVE BIOGEL PI IND STRL 8 (GLOVE) ×1 IMPLANT
GLOVE BIOGEL PI INDICATOR 8 (GLOVE) ×2
GLOVE INDICATOR 8.0 STRL GRN (GLOVE) ×3 IMPLANT
GOWN STRL REUS W/ TWL LRG LVL3 (GOWN DISPOSABLE) ×1 IMPLANT
GOWN STRL REUS W/ TWL XL LVL3 (GOWN DISPOSABLE) ×1 IMPLANT
GOWN STRL REUS W/TWL LRG LVL3 (GOWN DISPOSABLE) ×2
GOWN STRL REUS W/TWL XL LVL3 (GOWN DISPOSABLE) ×2
HOLDER FOLEY CATH W/STRAP (MISCELLANEOUS) IMPLANT
HOOD PEEL AWAY FLYTE STAYCOOL (MISCELLANEOUS) ×15 IMPLANT
INSERT TIB BEARING 71 10 (Insert) ×3 IMPLANT
KIT TURNOVER KIT A (KITS) ×3 IMPLANT
NDL SAFETY ECLIPSE 18X1.5 (NEEDLE) ×2 IMPLANT
NEEDLE HYPO 18GX1.5 SHARP (NEEDLE) ×4
NEEDLE SPNL 20GX3.5 QUINCKE YW (NEEDLE) ×3 IMPLANT
NS IRRIG 1000ML POUR BTL (IV SOLUTION) ×3 IMPLANT
PACK TOTAL KNEE (MISCELLANEOUS) ×3 IMPLANT
PAD WRAPON POLAR KNEE (MISCELLANEOUS) ×1 IMPLANT
PATELLA STD 34X8.5 (Orthopedic Implant) ×3 IMPLANT
PLATE KNEE TIBIAL 71MM FIXED (Plate) ×3 IMPLANT
PULSAVAC PLUS IRRIG FAN TIP (DISPOSABLE) ×3
SOL .9 NS 3000ML IRR  AL (IV SOLUTION) ×2
SOL .9 NS 3000ML IRR UROMATIC (IV SOLUTION) ×1 IMPLANT
STAPLER SKIN PROX 35W (STAPLE) ×3 IMPLANT
SUCTION FRAZIER HANDLE 10FR (MISCELLANEOUS) ×2
SUCTION TUBE FRAZIER 10FR DISP (MISCELLANEOUS) ×1 IMPLANT
SUT VIC AB 0 CT1 36 (SUTURE) ×9 IMPLANT
SUT VIC AB 2-0 CT1 27 (SUTURE) ×6
SUT VIC AB 2-0 CT1 TAPERPNT 27 (SUTURE) ×3 IMPLANT
SYR 10ML LL (SYRINGE) ×3 IMPLANT
SYR 20ML LL LF (SYRINGE) ×3 IMPLANT
SYR 30ML LL (SYRINGE) ×9 IMPLANT
TIP FAN IRRIG PULSAVAC PLUS (DISPOSABLE) ×1 IMPLANT
TRAY FOLEY MTR SLVR 16FR STAT (SET/KITS/TRAYS/PACK) IMPLANT
WRAPON POLAR PAD KNEE (MISCELLANEOUS) ×3

## 2019-06-16 NOTE — H&P (Signed)
History of Present Illness: Claire Miller is a 59 y.o. female who presents today for her surgical history and physical for upcoming right total knee arthroplasty. Surgery was scheduled with Dr. Roland Rack on 06/16/2019. Patient reports a 2 out of 10 pain score to the right knee at this time. She denies any falls or trauma affecting the right knee since her last evaluation. The patient denies any numbness or tingling to the right lower extremity. She denies any personal history of heart attack, stroke, asthma or COPD. No personal history of blood clots.  Past Medical History: . Hypertension   Past Surgical History: . BACK SURGERY  . foot surgery  bilateral  . gall bladder removal  . Left carpal tunnel release   Past Family History: . Breast cancer Mother   Medications: . acetaminophen (TYLENOL) 500 MG tablet Take 500-1,000 mg by mouth every 6 (six) hours as needed  . aspirin 81 MG EC tablet Take 1 tablet by mouth once daily  . biotin 5 mg Tab Take 5,000 mg by mouth once daily  . cholecalciferol (VITAMIN D3) 1000 unit tablet Take 1,000 Units by mouth nightly  . docosahexaenoic acid/epa (FISH OIL ORAL) Take 1,000 mg by mouth once daily  . ibuprofen (MOTRIN) 200 MG tablet Take 400 mg by mouth every 8 (eight) hours as needed  . magnesium 250 mg Tab Take 250 mg by mouth once daily  . menthol Gel Apply 1 Application topically once daily as needed  . metoprolol succinate (TOPROL-XL) 100 MG XL tablet Take 100 mg by mouth once daily  . multivitamin (MULTIPLE VITAMIN ORAL) Take 1 tablet by mouth nightly  . multivitamin capsule Take 1 capsule by mouth once daily  . omeprazole (PRILOSEC) 20 MG DR capsule Take 20 mg by mouth once daily  . red yeast rice 600 mg Cap Take 600 mg by mouth once daily  . rosuvastatin (CRESTOR) 5 MG tablet Take 1 tablet by mouth nightly  . TURMERIC ORAL Take 1,650 mg by mouth once daily  . losartan (COZAAR) 50 MG tablet Take 50 mg by mouth once daily   No current  Epic-ordered facility-administered medications on file.   Allergies: . Sulfa (Sulfonamide Antibiotics) Swelling   Review of Systems:  A comprehensive 14 point ROS was performed, reviewed by me today, and the pertinent orthopaedic findings are documented in the HPI.  Physical Exam: BP 110/80  Ht 167.6 cm (5\' 6" )  Wt 100.3 kg (221 lb 3.2 oz)  BMI 35.70 kg/m  General/Constitutional: The patient appears to be well-nourished, well-developed, and in no acute distress. Neuro/Psych: Normal mood and affect, oriented to person, place and time. Eyes: Non-icteric. Pupils are equal, round, and reactive to light, and exhibit synchronous movement. ENT: Unremarkable. Lymphatic: No palpable adenopathy. Respiratory: Lungs clear to auscultation, Normal chest excursion, No wheezes and Non-labored breathing Cardiovascular: Regular rate and rhythm. No murmurs. and No edema, swelling or tenderness, except as noted in detailed exam. Integumentary: No impressive skin lesions present, except as noted in detailed exam. Musculoskeletal: Unremarkable, except as noted in detailed exam.  Rightknee exam: GAIT:Mild limpand uses no assistive devices. ALIGNMENT:Mild varus SKIN:Unremarkable SWELLING:Mild swelling diffusely around the knee EFFUSION:Trace WARMTH:None TENDERNESS:Mild tenderness along medial and lateral joint lines SWN:IOEVOJJK, she lacks 20 degrees of extension, but is able to flex her knee to 110 degrees. Passively, she is able to be extended to within 8 degrees of full extension and flexed to 115 degrees. McMURRAY'S:Equivocal PATELLOFEMORAL:Normal tracking with no peri-patellar tenderness and negative apprehension sign CREPITUS:Mild  patellofemoral crepitance LACHMAN'S:Negative PIVOT SHIFT:Negative ANTERIOR DRAWER:Negative POSTERIOR DRAWER:Negative VARUS/VALGUS:Positive pseudolaxity to varus stressing  She remains neurovascularly intact to the right lower extremity and  foot.  MRI results:  1. Advanced tricompartmental osteoarthritis, most severe within the  medial and patellofemoral compartments where there is full-thickness  cartilage loss.  2. Medial meniscal degeneration with fraying/irregularity of the  free edge and under surfaces of the posterior horn and body  segments.  3. Small knee joint effusion with numerous intra-articular loose  bodies, the largest measuring up to 1.7 cm posterior to the lateral  femoral condyle.   Impression: Primary osteoarthritis of right knee [M17.11]  Plan:  1. Treatment options were discussed today with the patient. 2. The patient is scheduled for a right total knee arthroplasty with Dr. Joice Lofts on 06/16/2019. 3. The patient was instructed on the risk and benefits of surgery and wishes to proceed at this time. 4. This document will serve as a surgical history and physical for the patient. 5. The patient will follow-up per standard postop protocol. They can call the clinic they have any questions, new symptoms develop or symptoms worsen.  The procedure was discussed with the patient, as were the potential risks (including bleeding, infection, nerve and/or blood vessel injury, persistent or recurrent pain, failure of the hardware, need for further surgery, blood clots, strokes, heart attacks and/or arhythmias, pneumonia, etc.) and benefits. The patient states her understanding and wishes to proceed.   H&P reviewed and patient re-examined. No changes.

## 2019-06-16 NOTE — Anesthesia Preprocedure Evaluation (Addendum)
Anesthesia Evaluation  Patient identified by MRN, date of birth, ID band Patient awake    Reviewed: Allergy & Precautions, H&P , NPO status , reviewed documented beta blocker date and time   History of Anesthesia Complications (+) history of anesthetic complications  Airway Mallampati: III  TM Distance: >3 FB Neck ROM: full    Dental  (+) Teeth Intact   Pulmonary    Pulmonary exam normal        Cardiovascular hypertension, Normal cardiovascular exam  03/2019 ECHO IMPRESSIONS    1. Left ventricular ejection fraction, by estimation, is 60 to 65%. The  left ventricle has normal function. The left ventricle has no regional  wall motion abnormalities. There is mild left ventricular hypertrophy.  Left ventricular diastolic parameters  are consistent with Grade I diastolic dysfunction (impaired relaxation).  2. Right ventricular systolic function is normal. The right ventricular  size is normal. Tricuspid regurgitation signal is inadequate for assessing  PA pressure.  3. The mitral valve is normal in structure. No evidence of mitral valve  regurgitation. No evidence of mitral stenosis.  4. The aortic valve is normal in structure. Aortic valve regurgitation is  not visualized. No aortic stenosis is present.  5. The inferior vena cava is normal in size with greater than 50%  respiratory variability, suggesting right atrial pressure of 3 mmHg.    Neuro/Psych  Headaches,    GI/Hepatic GERD  Medicated and Controlled,  Endo/Other    Renal/GU Renal disease     Musculoskeletal   Abdominal   Peds  Hematology   Anesthesia Other Findings Past Medical History: 2009: Complication of anesthesia     Comment:  woke up during foot surgery  No date: Headache No date: History of kidney stones No date: Hyperlipidemia No date: Hypertension No date: Kidney stones  Past Surgical History: 01/09/1988: BACK SURGERY No date:  CARPAL TUNNEL RELEASE; Left 2012: CHOLECYSTECTOMY No date: FOOT SURGERY; Bilateral  BMI    Body Mass Index: 33.80 kg/m      Reproductive/Obstetrics                            Anesthesia Physical Anesthesia Plan  ASA: II  Anesthesia Plan: Spinal   Post-op Pain Management:    Induction: Intravenous  PONV Risk Score and Plan: Treatment may vary due to age or medical condition and TIVA  Airway Management Planned: Nasal Cannula and Natural Airway  Additional Equipment:   Intra-op Plan:   Post-operative Plan:   Informed Consent: I have reviewed the patients History and Physical, chart, labs and discussed the procedure including the risks, benefits and alternatives for the proposed anesthesia with the patient or authorized representative who has indicated his/her understanding and acceptance.     Dental Advisory Given  Plan Discussed with: CRNA  Anesthesia Plan Comments:        Anesthesia Quick Evaluation

## 2019-06-16 NOTE — Transfer of Care (Signed)
Immediate Anesthesia Transfer of Care Note  Patient: Claire Miller  Procedure(s) Performed: TOTAL KNEE ARTHROPLASTY - RNFA (Right Knee)  Patient Location: PACU  Anesthesia Type:Spinal  Level of Consciousness: sedated  Airway & Oxygen Therapy: Patient Spontanous Breathing and Patient connected to face mask oxygen  Post-op Assessment: Report given to RN and Post -op Vital signs reviewed and stable  Post vital signs: Reviewed and stable  Last Vitals:  Vitals Value Taken Time  BP 100/53 06/16/19 1026  Temp    Pulse 73 06/16/19 1027  Resp 23 06/16/19 1027  SpO2 98 % 06/16/19 1027  Vitals shown include unvalidated device data.  Last Pain:  Vitals:   06/16/19 0635  TempSrc: Tympanic  PainSc: 0-No pain         Complications: No complications documented.

## 2019-06-16 NOTE — Op Note (Signed)
06/16/2019  10:49 AM  Patient:   Claire Miller  Pre-Op Diagnosis:   Degenerative joint disease, right knee.  Post-Op Diagnosis:   Same  Procedure:   Right TKA using all-cemented [employer] Vanguard system with a 67.5 mm mm PCR femur, a 71 mm tibial tray with a 10 mm anterior stabilized E-poly insert, and a 34 x 8.5 mm all-poly 3-pegged domed patella.  Surgeon:   Maryagnes Amos, MD  Assistant:   Joaquim Nam, RNFA; Nyra Jabs, PA-S  Anesthesia:   Spinal  Findings:   As above  Complications:   None  EBL:   10 cc  Fluids:   1200 cc crystalloid  UOP:   None  TT:   110 minutes at 300 mmHg  Drains:   None  Closure:   Staples  Implants:   As above  Brief Clinical Note:   The patient is a 59 year old female with a long history of progressively worsening right knee pain. The patient's symptoms have progressed despite medications, activity modification, injections, etc. The patient's history and examination were consistent with advanced degenerative joint disease of the right knee confirmed by plain radiographs. The patient presents at this time for a right total knee arthroplasty.  Procedure:   The patient was brought into the operating room. After adequate spinal anesthesia was obtained, the patient was lain in the supine position.  The right lower extremity was prepped with ChloraPrep solution and draped sterilely. Preoperative antibiotics were administered. After verifying the proper laterality with a surgical timeout, the limb was exsanguinated with an Esmarch and the tourniquet inflated to 300 mmHg. A standard anterior approach to the knee was made through an approximately 7 inch incision. The incision was carried down through the subcutaneous tissues to expose superficial retinaculum. This was split the length of the incision and the medial flap elevated sufficiently to expose the medial retinaculum. The medial retinaculum was incised, leaving a 3-4 mm cuff of tissue on the  patella. This was extended distally along the medial border of the patellar tendon and proximally through the medial third of the quadriceps tendon. A subtotal fat pad excision was performed before the soft tissues were elevated off the anteromedial and anterolateral aspects of the proximal tibia to the level of the collateral ligaments. The anterior portions of the medial and lateral menisci were removed, as was the anterior cruciate ligament. With the knee flexed to 90, the external tibial guide was positioned and the appropriate proximal tibial cut made. This piece was taken to the back table where it was measured and found to be optimally replicated by a 71 mm component.  Attention was directed to the distal femur. The intramedullary canal was accessed through a 3/8" drill hole. The intramedullary guide was inserted and positioned in order to obtain a neutral flexion gap. The intercondylar block was positioned with care taken to avoid notching the anterior cortex of the femur. The appropriate cut was made. Next, the distal cutting block was placed at 5 of valgus alignment. Using the 9 mm slot, the distal cut was made. The distal femur was measured and found to be optimally replicated by the 67.5 mm component. The 67.5 mm 4-in-1 cutting block was positioned and first the posterior, then the posterior chamfer, the anterior chamfer, and finally the anterior cuts were made. At this point, the posterior portions medial and lateral menisci were removed. A trial reduction was performed using the appropriate femoral and tibial components with the 10 mm insert. This demonstrated excellent  stability to varus and valgus stressing both in flexion and extension while permitting full extension. Patella tracking was assessed and found to be excellent. Therefore, the tibial guide position was marked on the proximal tibia. The patella thickness was measured and found to be 20 mm. Therefore, the appropriate cut was made. The  patellar surface was measured and found to be optimally replicated by the 34 mm component. The three peg holes were drilled in place before the trial button was inserted. Patella tracking was assessed and found to be excellent, passing the "no thumb test". The lug holes were drilled into the distal femur before the trial component was removed, leaving only the tibial tray. The keel was then created using the appropriate tower, reamer, and punch.  The bony surfaces were prepared for cementing by irrigating them thoroughly with bacitracin saline solution via the jet lavage system. A bone plug was fashioned from some of the bone that had been removed previously and used to plug the distal femoral canal. In addition, 20 cc of Exparel diluted out to 60 cc with normal saline and 30 cc of 0.5% Sensorcaine were injected into the postero-medial and postero-lateral aspects of the knee, the medial and lateral gutter regions, and the peri-incisional tissues to help with postoperative analgesia. Meanwhile, the cement was being mixed on the back table. When it was ready, the tibial tray was cemented in first. The excess cement was removed using [employer] fast streamer. Next, the femoral component was impacted into place. Again, the excess cement was removed using [employer] fast streamer. The 10 mm trial insert was positioned and the knee brought into extension while the cement hardened. Finally, the patella was cemented into place and secured using the patellar clamp. Again, the excess cement was removed using [employer] fast streamer. Once the cement had hardened, the knee was placed through a range of motion with the findings as described above. Therefore, the trial insert was removed and, after verifying that no cement had been retained posteriorly, the permanent 10 mm anterior stabilized E-polyethylene insert was positioned and secured using the appropriate key locking mechanism. Again the knee was placed through a range of motion with the findings  as described above.  The wound was copiously irrigated with sterile saline solution using the jet lavage system before the quadriceps tendon and retinacular layer were reapproximated using #0 Vicryl interrupted sutures. The superficial retinacular layer also was closed using a running #0 Vicryl suture. A total of 10 cc of transexemic acid (TXA) was injected intra-articularly before the subcutaneous tissues were closed in several layers using 2-0 Vicryl interrupted sutures. The skin was closed using staples. A sterile honeycomb dressing was applied to the skin before the leg was wrapped with an Ace wrap to accommodate the Polar Care device. The patient was then awakened and returned to the recovery room in satisfactory condition after tolerating the procedure well.

## 2019-06-16 NOTE — Anesthesia Procedure Notes (Signed)
Spinal  Patient location during procedure: OR Start time: 06/16/2019 7:41 AM End time: 06/16/2019 7:47 AM Staffing Performed: resident/CRNA  Resident/CRNA: Justus Memory, CRNA Preanesthetic Checklist Completed: patient identified, IV checked, site marked, risks and benefits discussed, surgical consent, monitors and equipment checked, pre-op evaluation and timeout performed Spinal Block Patient position: sitting Prep: Betadine Patient monitoring: heart rate, continuous pulse ox, blood pressure and cardiac monitor Approach: midline Location: L2-3 Injection technique: single-shot Needle Needle type: Introducer and Pencan  Needle gauge: 24 G Needle length: 9 cm Additional Notes Negative paresthesia. Negative blood return. Positive free-flowing CSF. Expiration date of kit checked and confirmed. Patient tolerated procedure well, without complications.

## 2019-06-16 NOTE — Progress Notes (Signed)
Shift Summary:  Post OP Day 0:  TOTAL KNEE ARTHROPLASTY 06/16/19  Patient A/o x 4 and weight-bearing as tolerated C/o nausea, patient medicated per eMAR Patient has CPM at bedside and foot pumps are on feet. Patient on Room Air, voids and ambulated in room x 1 assist. PIV saline locked. Will continue to monitor.

## 2019-06-16 NOTE — Anesthesia Postprocedure Evaluation (Signed)
Anesthesia Post Note  Patient: Claire Miller  Procedure(s) Performed: TOTAL KNEE ARTHROPLASTY - RNFA (Right Knee)  Patient location during evaluation: PACU Anesthesia Type: Spinal Level of consciousness: awake and alert Pain management: pain level controlled Vital Signs Assessment: post-procedure vital signs reviewed and stable Respiratory status: spontaneous breathing and respiratory function stable Cardiovascular status: blood pressure returned to baseline and stable Postop Assessment: spinal receding Anesthetic complications: no   No complications documented.   Last Vitals:  Vitals:   06/16/19 1255 06/16/19 1346  BP: 107/65 107/69  Pulse: 66 77  Resp: 16 17  Temp: (!) 36.3 C 36.7 C  SpO2: 97% 94%    Last Pain:  Vitals:   06/16/19 1346  TempSrc: Oral  PainSc:                  Christia Reading

## 2019-06-16 NOTE — Evaluation (Signed)
Physical Therapy Evaluation Patient Details Name: Claire Miller MRN: [id number] DOB: March 16, 1960 Today's Date: 06/16/2019   History of Present Illness  59 y/o female s/p R TKA 6/10.  Clinical Impression  Pt eager to work with PT and had a good POD0 session.  She was able to do SLRs and had ~75 degrees of AROM (>80 PROM) though she is lacking ~2* of extension.  Pt with safe ambulation, though guarded and reliant on the walker.  PT eager to work hard with PT despite pain, likely will be appropriate for outpt PT when medically cleared for d/c.    Follow Up Recommendations Follow surgeon's recommendation for DC plan and follow-up therapies    Equipment Recommendations  Rolling walker with 5" wheels;3in1 (PT)    Recommendations for Other Services       Precautions / Restrictions Precautions Precautions: Fall;Knee Restrictions Weight Bearing Restrictions: Yes RLE Weight Bearing: Weight bearing as tolerated      Mobility  Bed Mobility Overal bed mobility: Independent                Transfers Overall transfer level: Modified independent Equipment used: Rolling walker (2 wheeled)             General transfer comment: cuing for hand placement and sequencing, no assist needed  Ambulation/Gait Ambulation/Gait assistance: Supervision Gait Distance (Feet): 65 Feet Assistive device: Rolling walker (2 wheeled)       General Gait Details: Pt was able to ambulate with slow and guarded but safe cadence.  Some hesitancy with full TKE tolerance in WBing with increased UE use, but no safety concerns or excessive fatigue  Stairs            Wheelchair Mobility    Modified Rankin (Stroke Patients Only)       Balance Overall balance assessment: Modified Independent                                           Pertinent Vitals/Pain Pain Assessment: 0-10 Pain Score: 8  Pain Location: R knee    Home Living Family/patient expects to be discharged  to:: Private residence Living Arrangements: Spouse/significant other Available Help at Discharge: Family;Available 24 hours/day Type of Home: House Home Access: Stairs to enter Entrance Stairs-Rails: None Entrance Stairs-Number of Steps: 2          Prior Function Level of Independence: Independent         Comments: 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        Extremity/Trunk Assessment   Upper Extremity Assessment Upper Extremity Assessment: Overall WFL for tasks assessed    Lower Extremity Assessment Lower Extremity Assessment: Overall WFL for tasks assessed (expected post-op weakness, able to SLR)       Communication   Communication: No difficulties  Cognition Arousal/Alertness: Awake/alert Behavior During Therapy: WFL for tasks assessed/performed Overall Cognitive Status: Within Functional Limits for tasks assessed                                        General Comments      Exercises Total Joint Exercises Ankle Circles/Pumps: AROM;10 reps Quad Sets: Strengthening;10 reps Short Arc Quad: AROM;10 reps Heel Slides: 10 reps;AROM Hip ABduction/ADduction: Strengthening;10 reps Straight  Leg Raises: AROM;10 reps Knee Flexion: PROM;5 reps Goniometric ROM: 2-81   Assessment/Plan    PT Assessment Patient needs continued PT services  PT Problem List Decreased strength;Decreased range of motion;Decreased activity tolerance;Decreased balance;Decreased mobility;Decreased coordination;Decreased knowledge of use of DME;Decreased safety awareness;Pain       PT Treatment Interventions DME instruction;Gait training;Stair training;Functional mobility training;Therapeutic activities;Therapeutic exercise;Balance training;Cognitive remediation    PT Goals (Current goals can be found in the Care Plan section)  Acute Rehab PT Goals Patient Stated Goal: go home PT Goal Formulation: With patient Time For Goal  Achievement: 06/30/19 Potential to Achieve Goals: Good    Frequency BID   Barriers to discharge        Co-evaluation               AM-PAC PT "6 Clicks" Mobility  Outcome Measure Help needed turning from your back to your side while in a flat bed without using bedrails?: None Help needed moving from lying on your back to sitting on the side of a flat bed without using bedrails?: None Help needed moving to and from a bed to a chair (including a wheelchair)?: None Help needed standing up from a chair using your arms (e.g., wheelchair or bedside chair)?: A Little Help needed to walk in hospital room?: A Little Help needed climbing 3-5 steps with a railing? : A Little 6 Click Score: 21    End of Session Equipment Utilized During Treatment: Gait belt Activity Tolerance: Patient tolerated treatment well Patient left: with chair alarm set;with call bell/phone within reach;with family/visitor present Nurse Communication: Mobility status PT Visit Diagnosis: Muscle weakness (generalized) (M62.81);Difficulty in walking, not elsewhere classified (R26.2)    Time: 3212-2482 PT Time Calculation (min) (ACUTE ONLY): 56 min   Charges:   PT Evaluation $PT Eval Low Complexity: 1 Low PT Treatments $Gait Training: 8-22 mins $Therapeutic Exercise: 8-22 mins         Malachi Pro, DPT 06/16/2019, 6:31 PM

## 2019-06-16 NOTE — TOC Transition Note (Signed)
Transition of Care [facility]) - CM/SW Discharge Note   Patient Details  Name: Claire Miller MRN: [id number] Date of Birth: [birth year]  [facility] CM/SW Contact:  Elease Hashimoto, LCSW Phone Number: 06/16/2019, 1:59 PM   Clinical Narrative:   Met with pt and husband who is at the bedside. He voiced he can assist for a few days due to being off from work. She has equipment from her family members and husband will make sure at home when gets there. Pt was independent prior to admission and hopeful she will be even better once knee heals. She has made her OPPT arrangements via [facility] and has an appointment already. Order sent through MD office. Pt is hopeful she will do well when PT comes in today. All needs met and will await medical stability to DC home.       Barriers to Discharge: No Barriers Identified   Patient Goals and CMS Choice Patient states their goals for this hospitalization and ongoing recovery are:: I hope when the therapist comes in I do well CMS Medicare.gov Compare Post Acute Care list provided to:: Patient Choice offered to / list presented to : Patient  Discharge Placement                       Discharge Plan and Services In-house Referral: Clinical Social Work Discharge Planning Services: NA                                 Social Determinants of Health (SDOH) Interventions     Readmission Risk Interventions No flowsheet data found.

## 2019-06-16 NOTE — Progress Notes (Signed)
PHARMACIST - PHYSICIAN ORDER COMMUNICATION  CONCERNING: P&T Medication Policy on Herbal Medications  DESCRIPTION:  This patient's order for:  Biotin, Turmeric, and Red Yeast Rice capsules  has been noted.  This product(s) is classified as an "herbal" or natural product. Due to a lack of definitive safety studies or FDA approval, nonstandard manufacturing practices, plus the potential risk of unknown drug-drug interactions while on inpatient medications, the Pharmacy and Therapeutics Committee does not permit the use of "herbal" or natural products of this type within [facility].   ACTION TAKEN: The pharmacy department is unable to verify this order at this time and your patient has been informed of this safety policy. Please reevaluate patient's clinical condition at discharge and address if the herbal or natural product(s) should be resumed at that time.  Bettey Costa, PharmD Clinical Pharmacist 06/16/2019 12:37 PM

## 2019-06-17 ENCOUNTER — Encounter: Payer: Self-pay | Admitting: Surgery

## 2019-06-17 DIAGNOSIS — M1711 Unilateral primary osteoarthritis, right knee: Secondary | ICD-10-CM | POA: Diagnosis not present

## 2019-06-17 LAB — CBC
HCT: 35.6 % — ABNORMAL LOW (ref 36.0–46.0)
Hemoglobin: 11.9 g/dL — ABNORMAL LOW (ref 12.0–15.0)
MCH: 31.3 pg (ref 26.0–34.0)
MCHC: 33.4 g/dL (ref 30.0–36.0)
MCV: 93.7 fL (ref 80.0–100.0)
Platelets: 191 10*3/uL (ref 150–400)
RBC: 3.8 MIL/uL — ABNORMAL LOW (ref 3.87–5.11)
RDW: 12.4 % (ref 11.5–15.5)
WBC: 8.3 10*3/uL (ref 4.0–10.5)
nRBC: 0 % (ref 0.0–0.2)

## 2019-06-17 LAB — BASIC METABOLIC PANEL
Anion gap: 8 (ref 5–15)
BUN: 16 mg/dL (ref 8–23)
CO2: 24 mmol/L (ref 22–32)
Calcium: 8.8 mg/dL — ABNORMAL LOW (ref 8.9–10.3)
Chloride: 107 mmol/L (ref 98–111)
Creatinine, Ser: 0.71 mg/dL (ref 0.44–1.00)
GFR calc Af Amer: >60 mL/min (ref >60)
GFR calc non Af Amer: >60 mL/min (ref >60)
Glucose, Bld: 122 mg/dL — ABNORMAL HIGH (ref 70–99)
Potassium: 4.2 mmol/L (ref 3.5–5.1)
Sodium: 139 mmol/L (ref 135–145)

## 2019-06-17 MED ORDER — ENOXAPARIN SODIUM 40 MG/0.4ML ~~LOC~~ SOLN
40.0000 mg | SUBCUTANEOUS | 0 refills | Status: DC
Start: 2019-06-17 — End: 2020-04-25

## 2019-06-17 MED ORDER — OXYCODONE HCL 5 MG PO TABS: 5.0000 mg | ORAL_TABLET | ORAL | 0 refills | Status: DC | PRN

## 2019-06-17 MED ORDER — TRAMADOL HCL 50 MG PO TABS: 50.0000 mg | ORAL_TABLET | ORAL | 0 refills | Status: DC | PRN

## 2019-06-17 MED ORDER — ASPIRIN EC 325 MG PO TBEC
325.0000 mg | DELAYED_RELEASE_TABLET | Freq: Every day | ORAL | 0 refills | Status: DC
Start: 2019-06-17 — End: 2020-08-27

## 2019-06-17 NOTE — Progress Notes (Signed)
Physical Therapy Treatment Patient Details Name: Claire Miller MRN: [id number] DOB: [birth year] Today's Date: 06/17/2019    History of Present Illness 59 y/o female s/p R TKA 6/10.    PT Comments    Pt continues to do very well with PT post TKA.  She had nearly 100 degrees of flexion (still lacking full TKE), was able to easily do SLRs, confidently circumambulated the nurses' station and managed stair negotiation with 2 different strategies.  Overall she had minimal pain (apart from during ROM) t/o the effort and feels good. From a PT perspective she should be able to discharge this afternoon if medically appropriate.   Follow Up Recommendations  Follow surgeon's recommendation for DC plan and follow-up therapies     Equipment Recommendations  Rolling walker with 5" wheels;3in1 (PT)    Recommendations for Other Services       Precautions / Restrictions Precautions Precautions: Fall;Knee Restrictions RLE Weight Bearing: Weight bearing as tolerated    Mobility  Bed Mobility Overal bed mobility: Independent                Transfers Overall transfer level: Modified independent Equipment used: Rolling walker (2 wheeled)             General transfer comment: cuing for hand placement and sequencing, no assist needed  Ambulation/Gait Ambulation/Gait assistance: Supervision Gait Distance (Feet): 250 Feet Assistive device: Rolling walker (2 wheeled)       General Gait Details: Pt was able to ambulate with slow and guarded but safe cadence.  Some hesitancy with full TKE tolerance in WBing with increased UE use, but no safety concerns or excessive fatigue   Stairs Stairs: Yes Stairs assistance: Supervision Stair Management: No rails;Backwards Number of Stairs: 4 General stair comments: 2 different strategies, backward with RW and then forward over single step.  Pt did well with both and feels comfortable with the effort   Wheelchair Mobility    Modified  Rankin (Stroke Patients Only)       Balance Overall balance assessment: Modified Independent                                          Cognition Arousal/Alertness: Awake/alert Behavior During Therapy: WFL for tasks assessed/performed Overall Cognitive Status: Within Functional Limits for tasks assessed                                        Exercises Total Joint Exercises Ankle Circles/Pumps: AROM;10 reps Quad Sets: Strengthening;10 reps Short Arc Quad: 15 reps;Strengthening Heel Slides: 10 reps;Strengthening (resisted leg extensions) Hip ABduction/ADduction: Strengthening;10 reps Straight Leg Raises: AROM;10 reps Knee Flexion: PROM;5 reps Goniometric ROM: 1-97    General Comments        Pertinent Vitals/Pain Pain Score: 2     Home Living                      Prior Function            PT Goals (current goals can now be found in the care plan section) Progress towards PT goals: Progressing toward goals    Frequency    BID      PT Plan Current plan remains appropriate    Co-evaluation  AM-PAC PT "6 Clicks" Mobility   Outcome Measure  Help needed turning from your back to your side while in a flat bed without using bedrails?: None Help needed moving from lying on your back to sitting on the side of a flat bed without using bedrails?: None Help needed moving to and from a bed to a chair (including a wheelchair)?: None Help needed standing up from a chair using your arms (e.g., wheelchair or bedside chair)?: None Help needed to walk in hospital room?: None Help needed climbing 3-5 steps with a railing? : None 6 Click Score: 24    End of Session Equipment Utilized During Treatment: Gait belt Activity Tolerance: Patient tolerated treatment well Patient left: with chair alarm set;with call bell/phone within reach;with family/visitor present Nurse Communication: Mobility status PT Visit Diagnosis:  Muscle weakness (generalized) (M62.81);Difficulty in walking, not elsewhere classified (R26.2)     Time: 3383-2919 PT Time Calculation (min) (ACUTE ONLY): 40 min  Charges:  $Gait Training: 8-22 mins $Therapeutic Exercise: 23-37 mins                     Malachi Pro, DPT 06/17/2019, 11:48 AM

## 2019-06-17 NOTE — Discharge Summary (Addendum)
Physician Discharge Summary  Patient ID: Claire Miller MRN: [id number] DOB/AGE: October 18, 1960 59 y.o.  Admit date: 06/16/2019 Discharge date: 06/17/2019  Admission Diagnoses:  Status post total knee replacement using cement, right [Z96.651]  Surgeries:Procedure(s): Right TKA using all-cemented Bi[employer] Vanguard system with a 67.5 mm mm PCR femur, a 71 mm tibial tray with a 10 mm anterior stabilized E-poly insert, and a 34 x 8.5 mm all-poly 3-pegged domed patella.  Surgeon:   Pascal Lux, MD  Assistant:   Ailene Ravel, RNFA; Kirkland Hun, PA-S  Anesthesia:   Spinal  Findings:   As above  Complications:   None  EBL:   10 cc  Fluids:   1200 cc crystalloid  UOP:   None  TT:   110 minutes at 300 mmHg  Drains:   None  Closure:   Staples  Implants:   As above  Discharge Diagnoses: Patient Active Problem List   Diagnosis Date Noted  . Status post total knee replacement using cement, right 06/16/2019    Past Medical History:  Diagnosis Date  . Complication of anesthesia 2009   woke up during foot surgery   . Headache   . History of kidney stones   . Hyperlipidemia   . Hypertension   . Kidney stones      Transfusion:    Consultants (if any):   Discharged Condition: Improved  Hospital Course: Claire Miller is an 59 y.o. female who was admitted 06/16/2019 with a diagnosis of right knee osteoarthritis and went to the operating room on 06/16/2019 and underwent right total knee arthroplasty. The patient received perioperative antibiotics for prophylaxis (see below). The patient tolerated the procedure well and was transported to PACU in stable condition. After meeting PACU criteria, the patient was subsequently transferred to the Orthopaedics/Rehabilitation unit.   The patient received DVT prophylaxis in the form of early mobilization, Lovenox. A sacral pad had been placed and heels were elevated off of the bed with rolled towels in order to protect skin  integrity.  The surgical incision was healing well without signs of infection.  Physical therapy was initiated postoperatively for transfers, gait training, and strengthening. Occupational therapy was initiated for activities of daily living and evaluation for assisted devices. Rehabilitation goals were reviewed in detail with the patient. The patient made steady progress with physical therapy and physical therapy recommended discharge to Home.   The patient achieved the preliminary goals of this hospitalization and was felt to be medically and orthopaedically appropriate for discharge.  She was given perioperative antibiotics:  Anti-infectives (From admission, onward)   Start     Dose/Rate Route Frequency Ordered Stop   06/16/19 1400  ceFAZolin (ANCEF) IVPB 2g/100 mL premix        2 g 200 mL/hr over 30 Minutes Intravenous Every 6 hours 06/16/19 1038 06/17/19 0324   06/16/19 0624  ceFAZolin (ANCEF) 2-4 GM/100ML-% IVPB       Note to Pharmacy: Myles Lipps   : cabinet override      06/16/19 0624 06/16/19 1029   06/16/19 0615  ceFAZolin (ANCEF) IVPB 2g/100 mL premix        2 g 200 mL/hr over 30 Minutes Intravenous On call to O.R. 06/16/19 0932 06/16/19 0748    .  Recent vital signs:  Vitals:   06/17/19 0728 06/17/19 1119  BP: 96/66 111/66  Pulse: (!) 58 (!) 58  Resp: 16 16  Temp: 98.3 F (36.8 C) 97.8 F (36.6 C)  SpO2: 94% 92%  Recent laboratory studies:  Recent Labs    06/16/19 1240 06/17/19 0637  WBC  --  8.3  HGB  --  11.9*  HCT  --  35.6*  PLT  --  191  K  --  4.2  CL  --  107  CO2  --  24  BUN  --  16  CREATININE 0.63 0.71  GLUCOSE  --  122*  CALCIUM  --  8.8*    Diagnostic Studies: DG Knee Right Port  Result Date: 06/16/2019 CLINICAL DATA:  Knee arthroplasty EXAM: PORTABLE RIGHT KNEE - 1-2 VIEW COMPARISON:  11/15/2018 FINDINGS: Interval postsurgical changes from right total knee arthroplasty. Arthroplasty components are in their expected alignment without  periprosthetic fracture. Expected postoperative changes are present within the overlying soft tissues. IMPRESSION: Interval postsurgical changes from right total knee arthroplasty without evidence of complication. Electronically Signed   By: Duanne Guess D.O.   On: 06/16/2019 12:42    Discharge Medications:   Allergies as of 06/17/2019      Reactions   Sulfa Antibiotics Swelling      Medication List    STOP taking these medications   TURMERIC PO     TAKE these medications   acetaminophen 500 MG tablet Commonly known as: TYLENOL Take 500-1,000 mg by mouth every 6 (six) hours as needed (for pain.).   aspirin EC 325 MG tablet Take 1 tablet (325 mg total) by mouth daily. What changed:   medication strength  how much to take  when to take this   BIOFREEZE EX Apply 1 application topically daily as needed (neck pain.).   Biotin 5000 MCG Tabs Take by mouth.   CALCIUM PO Take 1 tablet by mouth at bedtime.   cholecalciferol 25 MCG (1000 UNIT) tablet Commonly known as: VITAMIN D3 Take 1,000 Units by mouth at bedtime.   enoxaparin 40 MG/0.4ML injection Commonly known as: LOVENOX Inject 0.4 mLs (40 mg total) into the skin daily for 14 days.   Fish Oil 1000 MG Caps Take 1,000 mg by mouth at bedtime.   ibuprofen 200 MG tablet Commonly known as: ADVIL Take 400 mg by mouth every 8 (eight) hours as needed (for pain.).   losartan 50 MG tablet Commonly known as: COZAAR Take 1 tablet (50 mg total) by mouth daily. What changed: when to take this   Magnesium 250 MG Tabs Take 500 mg by mouth at bedtime.   metoprolol succinate 100 MG 24 hr tablet Commonly known as: TOPROL-XL Take 100 mg by mouth at bedtime.   multivitamin with minerals Tabs tablet Take 1 tablet by mouth at bedtime.   omeprazole 20 MG capsule Commonly known as: PRILOSEC Take 20 mg by mouth at bedtime.   oxyCODONE 5 MG immediate release tablet Commonly known as: Oxy IR/ROXICODONE Take 1 tablet (5 mg  total) by mouth every 4 (four) hours as needed for moderate pain (pain score 4-6).   Red Yeast Rice Extract 600 MG Caps Take 600 mg by mouth at bedtime.   rosuvastatin 5 MG tablet Commonly known as: CRESTOR Take 5 mg by mouth at bedtime.   traMADol 50 MG tablet Commonly known as: ULTRAM Take 1 tablet (50 mg total) by mouth every 4 (four) hours as needed for moderate pain.   VITAMIN C GUMMIES PO Take 2 tablets by mouth at bedtime.            Durable Medical Equipment  (From admission, onward)         Start  Ordered   06/16/19 1039  DME Bedside commode  Once       Question:  Patient needs a bedside commode to treat with the following condition  Answer:  Status post revision of total knee replacement, right   06/16/19 1038   06/16/19 1039  DME 3 n 1  Once        06/16/19 1038   06/16/19 1039  DME Walker rolling  Once       Question Answer Comment  Walker: With 5 Inch Wheels   Patient needs a walker to treat with the following condition Status post total knee replacement using cement, right      06/16/19 1038          Disposition: home with home health PT      Follow-up Information    Anson Oregon, PA-C Follow up on 07/01/2019.   Specialty: Physician Assistant Why: 9:15 AM Contact information: [address] [place] [address] [phone]        Poggi, Excell Seltzer, MD Follow up on 07/29/2019.   Specialty: Orthopedic Surgery Why: @ 1:15 PM Contact information: [address] [facility] [address] [facility] [place] [address] [phone]                Lasandra Beech, PA-C 06/17/2019, 1:02 PM

## 2019-06-17 NOTE — Discharge Instructions (Signed)
-  Administer 1 Lovenox injection per day for 14 days starting the day after you leave the hospital. -AFTER completing Lovenox injections, the next day begin taking one 325 mg aspirin daily for 4 weeks. Do NOT take aspirin and Lovenox together. -Change the honeycomb dressing as needed every 4-5 days.   -Keep the incision clean and dry. No showers until after your first post-op visit.

## 2019-06-17 NOTE — Progress Notes (Signed)
Pt had a BM after suppository. Dressing of the R knee was done. Faythe Dingwall was placed. DC instruction and education was done including injection of Lovenox SQ, husband included in the teaching. Pt left the floor via wheelchair.

## 2019-06-17 NOTE — Plan of Care (Deleted)

## 2019-06-17 NOTE — Plan of Care (Signed)
  Problem: Education: Goal: Knowledge of General Education information will improve Description: Including pain rating scale, medication(s)/side effects and non-pharmacologic comfort measures 06/17/2019 1314 by Vic Blackbird, RN Outcome: Progressing 06/17/2019 1311 by Vic Blackbird, RN Outcome: Progressing   Problem: Health Behavior/Discharge Planning: Goal: Ability to manage health-related needs will improve 06/17/2019 1314 by Vic Blackbird, RN Outcome: Progressing 06/17/2019 1311 by Vic Blackbird, RN Outcome: Progressing   Problem: Clinical Measurements: Goal: Ability to maintain clinical measurements within normal limits will improve 06/17/2019 1314 by Vic Blackbird, RN Outcome: Progressing 06/17/2019 1311 by Vic Blackbird, RN Outcome: Progressing Goal: Will remain free from infection 06/17/2019 1314 by Vic Blackbird, RN Outcome: Progressing 06/17/2019 1311 by Vic Blackbird, RN Outcome: Progressing Goal: Diagnostic test results will improve 06/17/2019 1314 by Vic Blackbird, RN Outcome: Progressing 06/17/2019 1311 by Vic Blackbird, RN Outcome: Progressing Goal: Respiratory complications will improve 06/17/2019 1314 by Vic Blackbird, RN Outcome: Progressing 06/17/2019 1311 by Vic Blackbird, RN Outcome: Progressing Goal: Cardiovascular complication will be avoided 06/17/2019 1314 by Vic Blackbird, RN Outcome: Progressing 06/17/2019 1311 by Vic Blackbird, RN Outcome: Progressing   Problem: Activity: Goal: Risk for activity intolerance will decrease 06/17/2019 1314 by Vic Blackbird, RN Outcome: Progressing 06/17/2019 1311 by Vic Blackbird, RN Outcome: Progressing   Problem: Nutrition: Goal: Adequate nutrition will be maintained 06/17/2019 1314 by Vic Blackbird, RN Outcome: Progressing 06/17/2019 1311 by Vic Blackbird, RN Outcome: Progressing   Problem: Coping: Goal: Level of anxiety will decrease 06/17/2019 1314 by Vic Blackbird, RN Outcome:  Progressing 06/17/2019 1311 by Vic Blackbird, RN Outcome: Progressing   Problem: Elimination: Goal: Will not experience complications related to bowel motility 06/17/2019 1314 by Vic Blackbird, RN Outcome: Progressing 06/17/2019 1311 by Vic Blackbird, RN Outcome: Progressing Goal: Will not experience complications related to urinary retention 06/17/2019 1314 by Vic Blackbird, RN Outcome: Progressing 06/17/2019 1311 by Vic Blackbird, RN Outcome: Progressing   Problem: Pain Managment: Goal: General experience of comfort will improve 06/17/2019 1314 by Vic Blackbird, RN Outcome: Progressing 06/17/2019 1311 by Vic Blackbird, RN Outcome: Progressing   Problem: Safety: Goal: Ability to remain free from injury will improve 06/17/2019 1314 by Vic Blackbird, RN Outcome: Progressing 06/17/2019 1311 by Vic Blackbird, RN Outcome: Progressing   Problem: Skin Integrity: Goal: Risk for impaired skin integrity will decrease 06/17/2019 1314 by Vic Blackbird, RN Outcome: Progressing 06/17/2019 1311 by Vic Blackbird, RN Outcome: Progressing

## 2019-06-17 NOTE — Progress Notes (Signed)
Physical Therapy Treatment Patient Details Name: Claire Miller MRN: [id number] DOB: 1960-03-23 Today's Date: 06/17/2019    History of Present Illness 59 y/o female s/p R TKA 6/10.    PT Comments    Pt continues to make excellent gains, with increased speed and confidence with ambulation, ability to manage steps w/o cuing or assist, ROM near 100 and consistently improving quad control, strength.  Pt safe to return home on d/c.   Follow Up Recommendations  Follow surgeon's recommendation for DC plan and follow-up therapies     Equipment Recommendations  None recommended by PT    Recommendations for Other Services       Precautions / Restrictions Precautions Precautions: Fall;Knee Precaution Booklet Issued: Yes (comment) (HEP) Restrictions RLE Weight Bearing: Weight bearing as tolerated    Mobility  Bed Mobility Overal bed mobility: Independent                Transfers Overall transfer level: Modified independent Equipment used: Rolling walker (2 wheeled)             General transfer comment: cuing for hand placement and sequencing, no assist needed  Ambulation/Gait Ambulation/Gait assistance: Supervision Gait Distance (Feet): 250 Feet Assistive device: Rolling walker (2 wheeled)       General Gait Details: Pt did very well with ambulation this afternoon and showed increased WBing tolerance, speed, confidence and had no safety concerns.  She showed good effort and generally did quite well.     Stairs Stairs: Yes Stairs assistance: Supervision Stair Management: No rails;Backwards Number of Stairs: 4 General stair comments: Pt was able to negotiate up/down steps w/o issue.  She was able to give verbal cues to her husband w/o further assist    Wheelchair Mobility    Modified Rankin (Stroke Patients Only)       Balance Overall balance assessment: Modified Independent                                          Cognition  Arousal/Alertness: Awake/alert Behavior During Therapy: WFL for tasks assessed/performed Overall Cognitive Status: Within Functional Limits for tasks assessed                                        Exercises Total Joint Exercises Ankle Circles/Pumps: AROM;10 reps Quad Sets: Strengthening;10 reps Short Arc Quad: 15 reps;Strengthening Heel Slides: 10 reps;Strengthening (with resisted leg extensions) Hip ABduction/ADduction: Strengthening;15 reps Straight Leg Raises: AROM;10 reps Knee Flexion: PROM;5 reps    General Comments        Pertinent Vitals/Pain Pain Score: 6     Home Living                      Prior Function            PT Goals (current goals can now be found in the care plan section) Progress towards PT goals: Progressing toward goals    Frequency    BID      PT Plan Current plan remains appropriate    Co-evaluation              AM-PAC PT "6 Clicks" Mobility   Outcome Measure  Help needed turning from your back to your side while in a flat bed without using bedrails?: None Help  needed moving from lying on your back to sitting on the side of a flat bed without using bedrails?: None Help needed moving to and from a bed to a chair (including a wheelchair)?: None Help needed standing up from a chair using your arms (e.g., wheelchair or bedside chair)?: None Help needed to walk in hospital room?: None Help needed climbing 3-5 steps with a railing? : None 6 Click Score: 24    End of Session Equipment Utilized During Treatment: Gait belt Activity Tolerance: Patient tolerated treatment well Patient left: with chair alarm set;with call bell/phone within reach;with family/visitor present Nurse Communication: Mobility status PT Visit Diagnosis: Muscle weakness (generalized) (M62.81);Difficulty in walking, not elsewhere classified (R26.2)     Time: 7262-0355 PT Time Calculation (min) (ACUTE ONLY): 28 min  Charges:  $Gait  Training: 8-22 mins $Therapeutic Exercise: 8-22 mins                     Malachi Pro, DPT 06/17/2019, 4:40 PM

## 2019-06-17 NOTE — Progress Notes (Signed)
  Subjective: 1 Day Post-Op Procedure(s) (LRB): TOTAL KNEE ARTHROPLASTY (Right) Patient reports pain as well-controlled.   Patient is well, but has had some minor complaints of continued numbness along the medial aspect of the lower leg  Plan is to go Home after hospital stay. Negative for chest pain and shortness of breath Fever: no Gastrointestinal: negative for nausea and vomiting.   Patient has not had a bowel movement.  Objective: Vital signs in last 24 hours: Temp:  [97.4 F (36.3 C)-98.4 F (36.9 C)] 97.8 F (36.6 C) (06/11 1119) Pulse Rate:  [57-77] 58 (06/11 1119) Resp:  [16-17] 16 (06/11 1119) BP: (96-125)/(62-85) 111/66 (06/11 1119) SpO2:  [92 %-97 %] 92 % (06/11 1119)  Intake/Output from previous day:  Intake/Output Summary (Last 24 hours) at 06/17/2019 1245 Last data filed at 06/17/2019 0400 Gross per 24 hour  Intake 1934.14 ml  Output --  Net 1934.14 ml    Intake/Output this shift: No intake/output data recorded.  Labs: Recent Labs    06/17/19 0637  HGB 11.9*   Recent Labs    06/17/19 0637  WBC 8.3  RBC 3.80*  HCT 35.6*  PLT 191   Recent Labs    06/16/19 1240 06/17/19 0637  NA  --  139  K  --  4.2  CL  --  107  CO2  --  24  BUN  --  16  CREATININE 0.63 0.71  GLUCOSE  --  122*  CALCIUM  --  8.8*   No results for input(s): LABPT, INR in the last 72 hours.   EXAM General - Patient is Alert, Appropriate and Oriented Extremity - Neurovascular intact Dorsiflexion/Plantar flexion intact Compartment soft Dressing/Incision -clean, dry, no drainage, Polar Care in place and working.  Motor Function - intact, moving foot and toes well on exam.  Cardiovascular- Regular rate and rhythm, no murmurs/rubs/gallops Respiratory- Lungs clear to auscultation bilaterally Gastrointestinal- soft, nontender and active bowel sounds   Assessment/Plan: 1 Day Post-Op Procedure(s) (LRB): TOTAL KNEE ARTHROPLASTY (Right) Active Problems:   Status post total  knee replacement using cement, right  Estimated body mass index is 33.8 kg/m as calculated from the following:   Height as of this encounter: 5\' 6"  (1.676 m).   Weight as of this encounter: 95 kg. Advance diet Up with therapy Discharge home with home health pending completion of therapy goals.    DVT Prophylaxis - Lovenox, Ted hose and SCDs Weight-Bearing as tolerated to right leg  , PA-C [facility] Orthopaedic Surgery 06/17/2019, 12:45 PM

## 2019-08-16 ENCOUNTER — Other Ambulatory Visit: Payer: Self-pay

## 2019-08-16 MED ORDER — LOSARTAN POTASSIUM 50 MG PO TABS: 50.0000 mg | ORAL_TABLET | Freq: Every day | ORAL | 7 refills | Status: DC

## 2020-02-16 ENCOUNTER — Other Ambulatory Visit: Payer: Self-pay | Admitting: *Deleted

## 2020-02-16 MED ORDER — LOSARTAN POTASSIUM 50 MG PO TABS: 50.0000 mg | ORAL_TABLET | Freq: Every day | ORAL | 3 refills | Status: DC

## 2020-04-09 ENCOUNTER — Other Ambulatory Visit
Admission: RE | Admit: 2020-04-09 | Discharge: 2020-04-09 | Disposition: A | Discharge status: Home / Self Care | Payer: BC Managed Care – PPO | Source: Ambulatory Visit | Attending: Internal Medicine | Admitting: Internal Medicine

## 2020-04-09 ENCOUNTER — Other Ambulatory Visit: Payer: Self-pay

## 2020-04-09 DIAGNOSIS — R933 Abnormal findings on diagnostic imaging of other parts of digestive tract: Secondary | ICD-10-CM | POA: Diagnosis not present

## 2020-04-09 DIAGNOSIS — Z79899 Other long term (current) drug therapy: Secondary | ICD-10-CM | POA: Diagnosis not present

## 2020-04-09 DIAGNOSIS — K317 Polyp of stomach and duodenum: Secondary | ICD-10-CM | POA: Diagnosis not present

## 2020-04-09 DIAGNOSIS — K449 Diaphragmatic hernia without obstruction or gangrene: Secondary | ICD-10-CM | POA: Diagnosis not present

## 2020-04-09 DIAGNOSIS — Z20822 Contact with and (suspected) exposure to covid-19: Secondary | ICD-10-CM | POA: Diagnosis not present

## 2020-04-09 DIAGNOSIS — Z96651 Presence of right artificial knee joint: Secondary | ICD-10-CM | POA: Diagnosis not present

## 2020-04-09 DIAGNOSIS — Z6834 Body mass index (BMI) 34.0-34.9, adult: Secondary | ICD-10-CM | POA: Diagnosis not present

## 2020-04-09 DIAGNOSIS — E669 Obesity, unspecified: Secondary | ICD-10-CM | POA: Diagnosis not present

## 2020-04-09 DIAGNOSIS — K2950 Unspecified chronic gastritis without bleeding: Secondary | ICD-10-CM | POA: Diagnosis not present

## 2020-04-09 DIAGNOSIS — Z7982 Long term (current) use of aspirin: Secondary | ICD-10-CM | POA: Diagnosis not present

## 2020-04-09 DIAGNOSIS — Z882 Allergy status to sulfonamides status: Secondary | ICD-10-CM | POA: Diagnosis not present

## 2020-04-09 DIAGNOSIS — Z01812 Encounter for preprocedural laboratory examination: Secondary | ICD-10-CM | POA: Insufficient documentation

## 2020-04-09 DIAGNOSIS — Z87442 Personal history of urinary calculi: Secondary | ICD-10-CM | POA: Diagnosis not present

## 2020-04-09 LAB — SARS CORONAVIRUS 2 (TAT 6-24 HRS): SARS Coronavirus 2: NEGATIVE

## 2020-04-11 ENCOUNTER — Ambulatory Visit: Payer: BC Managed Care – PPO | Admitting: Anesthesiology

## 2020-04-11 ENCOUNTER — Other Ambulatory Visit: Payer: Self-pay

## 2020-04-11 ENCOUNTER — Encounter
Admission: RE | Disposition: A | Discharge status: Home / Self Care | Payer: Self-pay | Source: Home / Self Care | Attending: Internal Medicine

## 2020-04-11 ENCOUNTER — Encounter: Payer: Self-pay | Admitting: Internal Medicine

## 2020-04-11 ENCOUNTER — Ambulatory Visit
Admission: RE | Admit: 2020-04-11 | Discharge: 2020-04-11 | Disposition: A | Discharge status: Home / Self Care | Payer: BC Managed Care – PPO | Attending: Internal Medicine | Admitting: Internal Medicine

## 2020-04-11 DIAGNOSIS — R933 Abnormal findings on diagnostic imaging of other parts of digestive tract: Secondary | ICD-10-CM | POA: Insufficient documentation

## 2020-04-11 DIAGNOSIS — K449 Diaphragmatic hernia without obstruction or gangrene: Secondary | ICD-10-CM | POA: Insufficient documentation

## 2020-04-11 DIAGNOSIS — Z87442 Personal history of urinary calculi: Secondary | ICD-10-CM | POA: Insufficient documentation

## 2020-04-11 DIAGNOSIS — Z20822 Contact with and (suspected) exposure to covid-19: Secondary | ICD-10-CM | POA: Insufficient documentation

## 2020-04-11 DIAGNOSIS — Z96651 Presence of right artificial knee joint: Secondary | ICD-10-CM | POA: Insufficient documentation

## 2020-04-11 DIAGNOSIS — Z7982 Long term (current) use of aspirin: Secondary | ICD-10-CM | POA: Insufficient documentation

## 2020-04-11 DIAGNOSIS — K317 Polyp of stomach and duodenum: Secondary | ICD-10-CM | POA: Insufficient documentation

## 2020-04-11 DIAGNOSIS — E669 Obesity, unspecified: Secondary | ICD-10-CM | POA: Insufficient documentation

## 2020-04-11 DIAGNOSIS — K2950 Unspecified chronic gastritis without bleeding: Secondary | ICD-10-CM | POA: Insufficient documentation

## 2020-04-11 DIAGNOSIS — Z79899 Other long term (current) drug therapy: Secondary | ICD-10-CM | POA: Insufficient documentation

## 2020-04-11 DIAGNOSIS — Z882 Allergy status to sulfonamides status: Secondary | ICD-10-CM | POA: Insufficient documentation

## 2020-04-11 DIAGNOSIS — Z6834 Body mass index (BMI) 34.0-34.9, adult: Secondary | ICD-10-CM | POA: Insufficient documentation

## 2020-04-11 HISTORY — PX: ESOPHAGOGASTRODUODENOSCOPY: SHX5428

## 2020-04-11 SURGERY — EGD (ESOPHAGOGASTRODUODENOSCOPY)
Anesthesia: General

## 2020-04-11 MED ORDER — PROPOFOL 10 MG/ML IV BOLUS
INTRAVENOUS | Status: DC | PRN
  Administered 2020-04-11: 80 mg via INTRAVENOUS

## 2020-04-11 MED ORDER — LIDOCAINE HCL (CARDIAC) PF 100 MG/5ML IV SOSY
PREFILLED_SYRINGE | INTRAVENOUS | Status: DC | PRN
  Administered 2020-04-11: 100 mg via INTRAVENOUS

## 2020-04-11 MED ORDER — LIDOCAINE HCL (PF) 2 % IJ SOLN
INTRAMUSCULAR | Status: AC
  Filled 2020-04-11: qty 5

## 2020-04-11 MED ORDER — PROPOFOL 500 MG/50ML IV EMUL
INTRAVENOUS | Status: DC | PRN
  Administered 2020-04-11: 150 ug/kg/min via INTRAVENOUS

## 2020-04-11 MED ORDER — PROPOFOL 500 MG/50ML IV EMUL
INTRAVENOUS | Status: AC
  Filled 2020-04-11: qty 50

## 2020-04-11 MED ORDER — SODIUM CHLORIDE 0.9 % IV SOLN: INTRAVENOUS | Status: DC

## 2020-04-11 NOTE — Op Note (Signed)
[facility] Gastroenterology Patient Name: Claire Miller Procedure Date: 04/11/2020 11:58 AM MRN: [id number] Account #: [id number] Date of Birth: [birth year] Admit Type: Outpatient Age: 60 Room: [facility] ENDO ROOM 2 Gender: Female Note Status: Finalized Procedure:             Upper GI endoscopy Indications:           Suspected gastro-esophageal reflux disease, Abnormal                         CT of the GI tract, Suspected esophagitis Providers:             Boykin Nearing. Norma Fredrickson MD, MD Referring MD:          Lorelei Pont (Referring MD) Medicines:             Propofol per Anesthesia Complications:         No immediate complications. Procedure:             Pre-Anesthesia Assessment:                        - The risks and benefits of the procedure and the                         sedation options and risks were discussed with the                         patient. All questions were answered and informed                         consent was obtained.                        - Patient identification and proposed procedure were                         verified prior to the procedure by the nurse. The                         procedure was verified in the procedure room.                        - ASA Grade Assessment: III - A patient with severe                         systemic disease.                        - After reviewing the risks and benefits, the patient                         was deemed in satisfactory condition to undergo the                         procedure.                        After obtaining informed consent, the endoscope was                         passed under direct vision.  Throughout the procedure,                         the patient's blood pressure, pulse, and oxygen                         saturations were monitored continuously. The Endoscope                         was introduced through the mouth, and advanced to the                         third part of  duodenum. The upper GI endoscopy was                         accomplished without difficulty. The patient tolerated                         the procedure well. Findings:      The esophagus was normal.      Multiple small sessile polyps with no bleeding and no stigmata of recent       bleeding were found in the gastric fundus. Biopsies were taken with a       cold forceps for histology.      A 1 cm hiatal hernia was present.      The examined duodenum was normal.      Patchy minimal inflammation characterized by erythema was found in the       gastric antrum.      The exam was otherwise without abnormality. Impression:            - Normal esophagus.                        - Multiple gastric polyps. Biopsied.                        - 1 cm hiatal hernia.                        - Normal examined duodenum.                        - Gastritis.                        - The examination was otherwise normal. Recommendation:        - Await pathology results.                        - Patient has a contact number available for                         emergencies. The signs and symptoms of potential                         delayed complications were discussed with the patient.                         Return to normal activities tomorrow. Written  discharge instructions were provided to the patient.                        - Resume previous diet.                        - Continue present medications.                        - Return to GI office in 2 months.                        - Follow up with Jacob Moores, PA-C in [ ]  months.                        - The findings and recommendations were discussed with                         the patient. Procedure Code(s):     --- Professional ---                        [phone], Esophagogastroduodenoscopy, flexible,                         transoral; with biopsy, single or multiple Diagnosis Code(s):     --- Professional ---                         R93.3, Abnormal findings on diagnostic imaging of                         other parts of digestive tract                        K29.70, Gastritis, unspecified, without bleeding                        K44.9, Diaphragmatic hernia without obstruction or                         gangrene                        K31.7, Polyp of stomach and duodenum CPT copyright 2019 [employer]. All rights reserved. The codes documented in this report are preliminary and upon coder review may  be revised to meet current compliance requirements. 2020 MD, MD 04/11/2020 12:24:30 PM This report has been signed electronically. Number of Addenda: 0 Note Initiated On: 04/11/2020 11:58 AM Estimated Blood Loss:  Estimated blood loss: none. Estimated blood loss: none.      [facility]

## 2020-04-11 NOTE — Anesthesia Preprocedure Evaluation (Addendum)
Anesthesia Evaluation  Patient identified by MRN, date of birth, ID band Patient awake    Reviewed: Allergy & Precautions, H&P , NPO status , Patient's Chart, lab work & pertinent test results  History of Anesthesia Complications Negative for: history of anesthetic complications  Airway Mallampati: II  TM Distance: >3 FB     Dental  (+) Teeth Intact   Pulmonary neg pulmonary ROS, neg sleep apnea, neg COPD,    breath sounds clear to auscultation       Cardiovascular hypertension, (-) angina(-) Past MI and (-) Cardiac Stents (-) dysrhythmias  Rhythm:regular Rate:Normal     Neuro/Psych  Headaches, negative psych ROS   GI/Hepatic negative GI ROS, Neg liver ROS,   Endo/Other  negative endocrine ROS  Renal/GU negative Renal ROS  negative genitourinary   Musculoskeletal   Abdominal   Peds  Hematology negative hematology ROS (+)   Anesthesia Other Findings Obesity  Past Medical History: 2009: Complication of anesthesia     Comment:  woke up during foot surgery  No date: Headache No date: History of kidney stones No date: Hyperlipidemia No date: Hypertension No date: Kidney stones  Past Surgical History: 01/09/1988: BACK SURGERY No date: CARPAL TUNNEL RELEASE; Left 2012: CHOLECYSTECTOMY No date: FOOT SURGERY; Bilateral 06/16/2019: TOTAL KNEE ARTHROPLASTY; Right     Comment:  Procedure: TOTAL KNEE ARTHROPLASTY;  Surgeon: Christena Flake, MD;  Location: [facility] ORS;  Service: Orthopedics;                Laterality: Right;  BMI    Body Mass Index: 34.95 kg/m      Reproductive/Obstetrics negative OB ROS                            Anesthesia Physical Anesthesia Plan  ASA: II  Anesthesia Plan: General   Post-op Pain Management:    Induction:   PONV Risk Score and Plan: Propofol infusion and TIVA  Airway Management Planned:   Additional Equipment:   Intra-op Plan:    Post-operative Plan:   Informed Consent: I have reviewed the patients History and Physical, chart, labs and discussed the procedure including the risks, benefits and alternatives for the proposed anesthesia with the patient or authorized representative who has indicated his/her understanding and acceptance.     Dental Advisory Given  Plan Discussed with: Anesthesiologist, CRNA and Surgeon  Anesthesia Plan Comments:         Anesthesia Quick Evaluation

## 2020-04-11 NOTE — Interval H&P Note (Signed)
History and Physical Interval Note:  04/11/2020 12:02 PM  Claire Miller  has presented today for surgery, with the diagnosis of GERD.  The various methods of treatment have been discussed with the patient and family. After consideration of risks, benefits and other options for treatment, the patient has consented to  Procedure(s): ESOPHAGOGASTRODUODENOSCOPY (EGD) (N/A) as a surgical intervention.  The patient's history has been reviewed, patient examined, no change in status, stable for surgery.  I have reviewed the patient's chart and labs.  Questions were answered to the patient's satisfaction.     [place], [place]

## 2020-04-11 NOTE — H&P (Signed)
Outpatient short stay form Pre-procedure 04/11/2020 12:00 PM Claire Miller K. Norma Fredrickson, M.D.  Primary Physician: Lorelei Pont, D.O.  Reason for visit:  Esophagitis, GERD, abnormal CT of the UGI tract  History of present illness:  60 y/o presents s/p ED visit atypical, non-cardiac chest pain, ruled out for MI. Patient has intermittent waterbrash, recently treated with Prilosec. No dysphagia. CT of the chest showed likely esophagitis with mucosal thickening, though neoplasm could not be ruled out.     Current Facility-Administered Medications:  .  0.9 %  sodium chloride infusion, , Intravenous, Continuous, [place], Boykin Nearing, MD, Last Rate: 20 mL/hr at 04/11/20 1057, Restarted at 04/11/20 1159  Medications Prior to Admission  Medication Sig Dispense Refill Last Dose  . acetaminophen (TYLENOL) 500 MG tablet Take 500-1,000 mg by mouth every 6 (six) hours as needed (for pain.).   Past Week at Unknown time  . Ascorbic Acid (VITAMIN C GUMMIES PO) Take 2 tablets by mouth at bedtime.   Past Week at Unknown time  . aspirin EC 325 MG tablet Take 1 tablet (325 mg total) by mouth daily. 30 tablet 0 04/10/2020 at Unknown time  . Biotin 5000 MCG TABS Take by mouth.   Past Week at Unknown time  . CALCIUM PO Take 1 tablet by mouth at bedtime.    Past Week at Unknown time  . cholecalciferol (VITAMIN D3) 25 MCG (1000 UNIT) tablet Take 1,000 Units by mouth at bedtime.   Past Week at Unknown time  . ibuprofen (ADVIL) 200 MG tablet Take 400 mg by mouth every 8 (eight) hours as needed (for pain.).   Past Week at Unknown time  . losartan (COZAAR) 50 MG tablet Take 1 tablet (50 mg total) by mouth daily. PLEASE CALL OFFICE TO SCHEDULE ANNUAL FOLLOW UP. 30 tablet 3 04/10/2020 at Unknown time  . Magnesium 250 MG TABS Take 500 mg by mouth at bedtime.    Past Week at Unknown time  . metoprolol succinate (TOPROL-XL) 100 MG 24 hr tablet Take 100 mg by mouth at bedtime.    04/10/2020 at Unknown time  . Multiple Vitamin (MULTIVITAMIN WITH  MINERALS) TABS tablet Take 1 tablet by mouth at bedtime.   Past Week at Unknown time  . Omega-3 Fatty Acids (FISH OIL) 1000 MG CAPS Take 1,000 mg by mouth at bedtime.   Past Week at Unknown time  . omeprazole (PRILOSEC) 20 MG capsule Take 20 mg by mouth at bedtime.    04/10/2020 at Unknown time  . rosuvastatin (CRESTOR) 5 MG tablet Take 5 mg by mouth at bedtime.    04/10/2020 at Unknown time  . enoxaparin (LOVENOX) 40 MG/0.4ML injection Inject 0.4 mLs (40 mg total) into the skin daily for 14 days. 5.6 mL 0   . Menthol, Topical Analgesic, (BIOFREEZE EX) Apply 1 application topically daily as needed (neck pain.). (Patient not taking: Reported on 06/16/2019)     . oxyCODONE (OXY IR/ROXICODONE) 5 MG immediate release tablet Take 1 tablet (5 mg total) by mouth every 4 (four) hours as needed for moderate pain (pain score 4-6). 30 tablet 0   . Red Yeast Rice Extract 600 MG CAPS Take 600 mg by mouth at bedtime.      . traMADol (ULTRAM) 50 MG tablet Take 1 tablet (50 mg total) by mouth every 4 (four) hours as needed for moderate pain. 30 tablet 0      Allergies  Allergen Reactions  . Sulfa Antibiotics Swelling     Past Medical History:  Diagnosis Date  . Complication of anesthesia 2009   woke up during foot surgery   . Headache   . History of kidney stones   . Hyperlipidemia   . Hypertension   . Kidney stones     Review of systems:  Otherwise negative.    Physical Exam  Gen: Alert, oriented. Appears stated age.  HEENT: [place]/AT. PERRLA. Lungs: CTA, no wheezes. CV: RR nl S1, S2. Abd: soft, benign, no masses. BS+ Ext: No edema. Pulses 2+    Planned procedures: Proceed with EGD. The patient understands the nature of the planned procedure, indications, risks, alternatives and potential complications including but not limited to bleeding, infection, perforation, damage to internal organs and possible oversedation/side effects from anesthesia. The patient agrees and gives consent to proceed.   Please refer to procedure notes for findings, recommendations and patient disposition/instructions.     Claire Miller K. Norma Fredrickson, M.D. Gastroenterology 04/11/2020  12:00 PM

## 2020-04-11 NOTE — Anesthesia Postprocedure Evaluation (Signed)
Anesthesia Post Note  Patient: Claire Miller  Procedure(s) Performed: ESOPHAGOGASTRODUODENOSCOPY (EGD) (N/A )  Patient location during evaluation: PACU Anesthesia Type: General Level of consciousness: awake and alert Pain management: pain level controlled Vital Signs Assessment: post-procedure vital signs reviewed and stable Respiratory status: spontaneous breathing, nonlabored ventilation and respiratory function stable Cardiovascular status: blood pressure returned to baseline and stable Postop Assessment: no apparent nausea or vomiting Anesthetic complications: no   No complications documented.   Last Vitals:  Vitals:   04/11/20 1242 04/11/20 1252  BP: 104/73 122/81  Pulse: 65 66  Resp: (!) 22 (!) 21  Temp:    SpO2: 92% 97%    Last Pain:  Vitals:   04/11/20 1252  TempSrc:   PainSc: 0-No pain                 Aurelio Brash [place] Devita

## 2020-04-11 NOTE — Transfer of Care (Signed)
Immediate Anesthesia Transfer of Care Note  Patient: Claire Miller  Procedure(s) Performed: ESOPHAGOGASTRODUODENOSCOPY (EGD) (N/A )  Patient Location: PACU  Anesthesia Type:General  Level of Consciousness: awake and alert   Airway & Oxygen Therapy: Patient Spontanous Breathing  Post-op Assessment: Report given to RN and Post -op Vital signs reviewed and stable  Post vital signs: stable  Last Vitals:  Vitals Value Taken Time  BP    Temp    Pulse    Resp    SpO2      Last Pain:  Vitals:   04/11/20 1028  TempSrc: Temporal  PainSc: 0-No pain         Complications: No complications documented.

## 2020-04-12 ENCOUNTER — Encounter: Payer: Self-pay | Admitting: Internal Medicine

## 2020-04-13 LAB — SURGICAL PATHOLOGY

## 2020-04-25 ENCOUNTER — Ambulatory Visit (INDEPENDENT_AMBULATORY_CARE_PROVIDER_SITE_OTHER): Payer: BC Managed Care – PPO | Admitting: Nurse Practitioner

## 2020-04-25 ENCOUNTER — Encounter: Payer: Self-pay | Admitting: Nurse Practitioner

## 2020-04-25 ENCOUNTER — Other Ambulatory Visit: Payer: Self-pay

## 2020-04-25 VITALS — BP 110/82 | HR 69 | Ht 65.5 in | Wt 228.1 lb

## 2020-04-25 DIAGNOSIS — R079 Chest pain, unspecified: Secondary | ICD-10-CM

## 2020-04-25 NOTE — Progress Notes (Signed)
Office Visit    Patient Name: Claire Miller Date of Encounter: 04/25/2020  Primary Care Provider:  Lorelei Pont, DO Primary Cardiologist:  Debbe Odea, MD  Chief Complaint    60 year old female with a history of chest pain and normal coronary CT angiography in March 2021, hypertension, hyperlipidemia, GERD, osteoarthritis, and obesity, who presents for follow-up of HTN.  Past Medical History    Past Medical History:  Diagnosis Date  . Chest pain    a. 03/2019 Cor CTA: Nl cors. cor Ca2+ = 13.7 (73rd %'ile). No significant non-cardiac findings.  . Complication of anesthesia 2009   woke up during foot surgery   . Diastolic dysfunction    a. 03/2019 Echo: EF 60-65%, no rwma, Gr1 DD, nl RV fxn.  . Gastric polyps   . Gastritis   . Headache   . Hiatal hernia   . History of kidney stones   . Hyperlipidemia   . Hypertension   . Kidney stones    Past Surgical History:  Procedure Laterality Date  . BACK SURGERY  01/09/1988  . CARPAL TUNNEL RELEASE Left   . CHOLECYSTECTOMY  2012  . ESOPHAGOGASTRODUODENOSCOPY N/A 04/11/2020   Procedure: ESOPHAGOGASTRODUODENOSCOPY (EGD);  Surgeon: [place], Boykin Nearing, MD;  Location: [facility];  Service: Gastroenterology;  Laterality: N/A;  . FOOT SURGERY Bilateral   . TOTAL KNEE ARTHROPLASTY Right 06/16/2019   Procedure: TOTAL KNEE ARTHROPLASTY;  Surgeon: Christena Flake, MD;  Location: [facility];  Service: Orthopedics;  Laterality: Right;    Allergies  Allergies  Allergen Reactions  . Sulfa Antibiotics Swelling    History of Present Illness    60 year old female with a history of chest pain and normal coronary CT angiography in March 2021, hypertension, hyperlipidemia, GERD, osteoarthritis, and obesity.  She was previously seen in early 2021 w/ atypical chest pain.  Coronary CTA showed nl cors w/ Ca2+ score of 13.7, placing her in the 73rd %'ile for age/sex.  She has been managed w/ asa and statin rx.  Echo in 03/2019 showed nl LVEF w/  grade 1 diast dysfxn.  More recently she underwent GI eval and EGD for GERD Ss.  This showed a nl esophagus, gastritis, multiple gastric polyps (bx w/ moderate chronic active gastritis; H pylori neg), and a 1cm hiatal hernia.  She has been taking prilosec 20mg  BID since then, and has noted significant improvement in GERD symptoms.  She has not otw experienced chest pain and denies palpitations, dyspnea, pnd, orthopnea, n, v, dizziness, syncope, weight gain, or early satiety.  Her BP has been well-controlled @ home on losartan and  blocker therapy.  She sits for long hours @ work and does note mild lower ext swelling @ the end of the day.  Her mother suffers from lymphedema, and she is interested in strategies to prevent progression of venous stasis.  Home Medications    Prior to Admission medications   Medication Sig Start Date End Date Taking? Authorizing Provider  Ascorbic Acid (VITAMIN C GUMMIE PO) Take by mouth daily at 8 pm.   Yes [provider]  aspirin EC 325 MG tablet Take 1 tablet (325 mg total) by mouth daily. 06/17/19  Yes 08/17/19, PA-C  Biotin 5000 MCG TABS Take by mouth.   Yes [provider]  cholecalciferol (VITAMIN D3) 25 MCG (1000 UNIT) tablet Take 1,000 Units by mouth at bedtime.   Yes [provider]  ibuprofen (ADVIL) 200 MG tablet Take 400 mg by mouth every 8 (  eight) hours as needed (for pain.).   Yes [provider]  losartan (COZAAR) 50 MG tablet Take 1 tablet (50 mg total) by mouth daily. PLEASE CALL OFFICE TO SCHEDULE ANNUAL FOLLOW UP. 02/16/20 05/16/20 Yes Agbor-Etang, Arlys John, MD  Magnesium 250 MG TABS Take 500 mg by mouth at bedtime.    Yes [provider]  metoprolol succinate (TOPROL-XL) 100 MG 24 hr tablet Take 100 mg by mouth at bedtime.  09/07/18  Yes [provider]  Multiple Vitamin (MULTIVITAMIN WITH MINERALS) TABS tablet Take 1 tablet by mouth at bedtime.   Yes [provider]  omeprazole  (PRILOSEC) 20 MG capsule Take 20 mg by mouth 2 (two) times daily before a meal.   Yes [provider]  rosuvastatin (CRESTOR) 5 MG tablet Take 5 mg by mouth at bedtime.  10/27/18  Yes [provider]     Review of Systems    GERD symptoms much improved on prilosec.  Notes lower ext swelling after sitting for long hours @ work.  She denies chest pain, palpitations, PND, dyspnea, orthopnea, dizziness, syncope, or early satiety.  All other systems reviewed and are otherwise negative except as noted above.  Physical Exam    VS:  BP 110/82 (BP Location: Left Arm, Patient Position: Sitting, Cuff Size: Large)   Pulse 69   Ht 5' 5.5" (1.664 m)   Wt 228 lb 2 oz (103.5 kg)   SpO2 98%   BMI 37.38 kg/m  , BMI Body mass index is 37.38 kg/m. GEN: Well nourished, well developed, in no acute distress. HEENT: normal. Neck: Supple, no JVD, carotid bruits, or masses. Cardiac: RRR, no murmurs, rubs, or gallops. No clubbing, cyanosis, edema.  Radials/PT 2+ and equal bilaterally.  Respiratory:  Respirations regular and unlabored, clear to auscultation bilaterally. GI: Soft, nontender, nondistended, BS + x 4. MS: no deformity or atrophy. Skin: warm and dry, no rash. Neuro:  Strength and sensation are intact. Psych: Normal affect.  Accessory Clinical Findings    ECG personally reviewed by me today -regular sinus rhythm, 70, left axis deviation - no acute changes.  Lab Results  Component Value Date   WBC 8.3 06/17/2019   HGB 11.9 (L) 06/17/2019   HCT 35.6 (L) 06/17/2019   MCV 93.7 06/17/2019   PLT 191 06/17/2019   Lab Results  Component Value Date   CREATININE 0.71 06/17/2019   BUN 16 06/17/2019   NA 139 06/17/2019   K 4.2 06/17/2019   CL 107 06/17/2019   CO2 24 06/17/2019   Lab Results  Component Value Date   ALT 23 06/08/2019   AST 21 06/08/2019   ALKPHOS 43 06/08/2019   BILITOT 0.8 06/08/2019    Assessment & Plan    1.  Chest pain: Patient with history of chest  and epigastric pain with coronary CT angiography in March 2021 showing normal coronary arteries and a calcium score of 13.7 (73rd percentile for age/sex).  Recent GI evaluation showing gastritis with improvement in symptoms on twice daily PPI.  Encourage lifestyle modifications, regular exercise, and weight loss.  Remains on statin therapy in the setting of coronary calcium.  2.  Essential hypertension: Well-controlled on beta-blocker and ARB therapy.  3.  Hyperlipidemia: Followed by primary care.  Remains on Crestor therapy and is tolerating well.  4.  Dependent edema: Patient frequently notes lower extremity swelling after sitting for long periods at work.  She has no edema this morning.  We discussed the importance of limiting sodium  intake as well as keeping her legs elevated when possible.  She is also willing to consider using compression stockings throughout the day while sitting for long periods.  5.  Disposition: Follow-up in 1 year or sooner if necessary.  Nicolasa Ducking, NP 04/25/2020, 12:21 PM

## 2020-04-25 NOTE — Patient Instructions (Addendum)
Medication Instructions:  No changes  *If you need a refill on your cardiac medications before your next appointment, please call your pharmacy*   Lab Work: None  If you have labs (blood work) drawn today and your tests are completely normal, you will receive your results only by: Marland Kitchen MyChart Message (if you have MyChart) OR . A paper copy in the mail If you have any lab test that is abnormal or we need to change your treatment, we will call you to review the results.   Testing/Procedures: None   Follow-Up: At [facility], you and your health needs are our priority.  As part of our continuing mission to provide you with exceptional heart care, we have created designated Provider Care Teams.  These Care Teams include your primary Cardiologist (physician) and Advanced Practice Providers (APPs -  Physician Assistants and Nurse Practitioners) who all work together to provide you with the care you need, when you need it.   Your next appointment:   1 year(s)  The format for your next appointment:   In Person  Provider:   Debbe Odea, MD

## 2020-04-27 ENCOUNTER — Ambulatory Visit: Payer: BC Managed Care – PPO | Admitting: Cardiology

## 2020-05-18 ENCOUNTER — Other Ambulatory Visit: Payer: Self-pay

## 2020-05-18 MED ORDER — LOSARTAN POTASSIUM 50 MG PO TABS: 50.0000 mg | ORAL_TABLET | Freq: Every day | ORAL | 9 refills | Status: DC

## 2020-08-15 ENCOUNTER — Telehealth: Payer: Self-pay | Admitting: Nurse Practitioner

## 2020-08-15 NOTE — Telephone Encounter (Signed)
Spoke with the patient. Patient  c/o epigastric pain associated with meals. She does have hx of acid reflux and cholecystectomy. She takes omeprazole daily. She denies chest pain or pressure, sob, n/v. Patient complains of a stinging pain in her left arm that radiates to her right. This has been going on for about 1 week.  Patient had a Coronary CTA and Echo in 2021 that were ok, determining that he chest pain was non-cardiac. Adv the patient that based on her cardiac work up it is unlikely that she is having ischemic pain. Adv the patient to keep her 09/03/20 apt with Dr. Azucena Cecil and to f/u with her pcp sooner to be evaluated for possible non-cardiac chest pain. She can contact our office in the interm if needed.  Patient agreeable with the plan and voiced appreciation for the call.

## 2020-08-15 NOTE — Telephone Encounter (Signed)
Pt c/o of Chest Pain: STAT if CP now or developed within 24 hours  1. Are you having CP right now? No   2. Are you experiencing any other symptoms (ex. SOB, nausea, vomiting, sweating)?  L arm pain   3. How long have you been experiencing CP? About a week radiates from L to R side of chest can be a stinging pain   4. Is your CP continuous or coming and going? Comes and goes   5. Have you taken Nitroglycerin? No   Scheduled next available 8/29 Agbor Etang  ?

## 2020-08-27 ENCOUNTER — Other Ambulatory Visit: Payer: Self-pay

## 2020-08-27 ENCOUNTER — Ambulatory Visit: Payer: BC Managed Care – PPO | Admitting: Cardiology

## 2020-08-27 ENCOUNTER — Encounter: Payer: Self-pay | Admitting: Cardiology

## 2020-08-27 VITALS — BP 130/90 | HR 96 | Ht 65.5 in | Wt 228.1 lb

## 2020-08-27 DIAGNOSIS — E7800 Pure hypercholesterolemia, unspecified: Secondary | ICD-10-CM

## 2020-08-27 DIAGNOSIS — Z6837 Body mass index (BMI) 37.0-37.9, adult: Secondary | ICD-10-CM

## 2020-08-27 DIAGNOSIS — R0600 Dyspnea, unspecified: Secondary | ICD-10-CM

## 2020-08-27 DIAGNOSIS — I10 Essential (primary) hypertension: Secondary | ICD-10-CM

## 2020-08-27 DIAGNOSIS — R0609 Other forms of dyspnea: Secondary | ICD-10-CM

## 2020-08-27 NOTE — Progress Notes (Signed)
Cardiology Office Note:    Date:  08/27/2020   ID:  [name], DOB 07-23-1960, MRN [id number]  PCP:  Claire Pont, DO  Cardiologist:  Debbe Odea, MD  Electrophysiologist:  None   Referring MD: Claire Pont, DO   Chief Complaint  Patient presents with   Other    Chest pain and elevated BP. Meds reviewed verbally with pt.    History of Present Illness:    Claire Miller is a 60 y.o. female with a hx of hypertension, hyperlipidemia, GERD, osteoarthritis of the right knee who presents due shortness of breath on exertion.  Patient states having occasional chest discomfort not associated with exertion.  She also has daytime fatigue, sleepiness, shortness of breath with exertion.  She endorsed snoring.  Also has gained some weight.  Saw PCP who ordered a D-dimer and noted to be abnormal.  CT scan is being planned.   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  Past Medical History:  Diagnosis Date   Chest pain    a. 03/2019 Cor CTA: Nl cors. cor Ca2+ = 13.7 (73rd %'ile). No significant non-cardiac findings.   Complication of anesthesia 2009   woke up during foot surgery    Diastolic dysfunction    a. 03/2019 Echo: EF 60-65%, no rwma, Gr1 DD, nl RV fxn.   Gastric polyps    Gastritis    Headache    Hiatal hernia    History of kidney stones    Hyperlipidemia    Hypertension    Kidney stones     Past Surgical History:  Procedure Laterality Date   BACK SURGERY  01/09/1988   CARPAL TUNNEL RELEASE Left    CHOLECYSTECTOMY  2012   ESOPHAGOGASTRODUODENOSCOPY N/A 04/11/2020   Procedure: ESOPHAGOGASTRODUODENOSCOPY (EGD);  Surgeon: Toledo, Boykin Nearing, MD;  Location: [facility] ENDOSCOPY;  Service: Gastroenterology;  Laterality: N/A;   FOOT SURGERY Bilateral    TOTAL KNEE ARTHROPLASTY Right 06/16/2019   Procedure: TOTAL KNEE ARTHROPLASTY;  Surgeon:  Christena Flake, MD;  Location: [facility] ORS;  Service: Orthopedics;  Laterality: Right;    Current Medications: Current Meds  Medication Sig   Ascorbic Acid (VITAMIN C GUMMIE PO) Take by mouth daily at 8 pm.   aspirin EC 81 MG tablet Take 81 mg by mouth daily. Swallow whole.   Biotin 5000 MCG TABS Take by mouth.   cholecalciferol (VITAMIN D3) 25 MCG (1000 UNIT) tablet Take 1,000 Units by mouth at bedtime.   famotidine (PEPCID) 20 MG tablet Take 20 mg by mouth 2 (two) times daily as needed for heartburn or indigestion.   ibuprofen (ADVIL) 200 MG tablet Take 400 mg by mouth every 8 (eight) hours as needed (for pain.).   losartan (COZAAR) 50 MG tablet Take 1 tablet (50 mg total) by mouth daily.   Magnesium 200 MG TABS Take by mouth daily at 2 am.   metoprolol succinate (TOPROL-XL) 100 MG 24 hr tablet Take 100 mg by mouth at bedtime.    Multiple Vitamin (MULTIVITAMIN WITH MINERALS) TABS tablet Take 1 tablet by mouth at bedtime.   omeprazole (PRILOSEC) 20 MG capsule Take 20 mg by mouth 2 (two) times daily before a meal.   rosuvastatin (CRESTOR) 5 MG tablet Take 5 mg by mouth at bedtime.      Allergies:   Sulfa antibiotics   Social History   Socioeconomic History   Marital status:  Married    Spouse name: Not on file   Number of children: Not on file   Years of education: Not on file   Highest education level: Not on file  Occupational History   Not on file  Tobacco Use   Smoking status: Never   Smokeless tobacco: Never  Vaping Use   Vaping Use: Never used  Substance and Sexual Activity   Alcohol use: Never   Drug use: Never   Sexual activity: Not on file  Other Topics Concern   Not on file  Social History Narrative   Not on file   Social Determinants of Health   Financial Resource Strain: Not on file  Food Insecurity: Not on file  Transportation Needs: Not on file  Physical Activity: Not on file  Stress: Not on file  Social Connections: Not on file     Family  History: Atrial fibrillation in the mother.  ROS:   Please see the history of present illness.     All other systems reviewed and are negative.  EKGs/Labs/Other Studies Reviewed:    The following studies were reviewed today:  EKG: EKG is obtained today.  EKG shows sinus rhythm, occasional PACs.  Recent Labs: No results found for requested labs within last 8760 hours.  Recent Lipid Panel No results found for: CHOL, TRIG, HDL, CHOLHDL, VLDL, LDLCALC, LDLDIRECT  Physical Exam:    VS:  BP 130/90 (BP Location: Left Arm, Patient Position: Sitting, Cuff Size: Large)   Pulse 96   Ht 5' 5.5" (1.664 m)   Wt 228 lb 2 oz (103.5 kg)   SpO2 97%   BMI 37.38 kg/m     Wt Readings from Last 3 Encounters:  08/27/20 228 lb 2 oz (103.5 kg)  04/25/20 228 lb 2 oz (103.5 kg)  04/11/20 210 lb (95.3 kg)     GEN:  Well nourished, well developed in no acute distress HEENT: Normal NECK: No JVD; No carotid bruits LYMPHATICS: No lymphadenopathy CARDIAC: RRR, no murmurs, rubs, gallops RESPIRATORY:  Clear to auscultation without rales, wheezing or rhonchi  ABDOMEN: Soft, non-tender, non-distended MUSCULOSKELETAL:  No edema; No deformity  SKIN: Warm and dry NEUROLOGIC:  Alert and oriented x 3 PSYCHIATRIC:  Normal affect   ASSESSMENT:    1. Dyspnea on exertion   2. Primary hypertension   3. Pure hypercholesterolemia   4. BMI 37.0-37.9, adult     PLAN:    In order of problems listed above:  Dyspnea on exertion, daytime somnolence, fatigue, snoring.  Previous echo showed preserved ejection fraction, coronary CTA with no obstructive CAD.  Patient has symptoms concerning for OSA.  Will refer to pulmonary medicine for OSA eval and possible treatment.. blood pressure reasonable.  Continue losartan 40 daily, Toprol-XL 10 mg daily. History of hyperlipidemia.  Continue statin as prescribed Obesity, weight loss, exercise advised.  Follow-up in 6 months  This note was generated in part or whole  with voice recognition software. Voice recognition is usually quite accurate but there are transcription errors that can and very often do occur. I apologize for any typographical errors that were not detected and corrected.  Medication Adjustments/Labs and Tests Ordered: Current medicines are reviewed at length with the patient today.  Concerns regarding medicines are outlined above.  Orders Placed This Encounter  Procedures   Ambulatory referral to Pulmonology   EKG 12-Lead    No orders of the defined types were placed in this encounter.   Patient Instructions  Medication Instructions:  Your  physician recommends that you continue on your current medications as directed. Please refer to the Current Medication list given to you today.  *If you need a refill on your cardiac medications before your next appointment, please call your pharmacy*   Lab Work: None ordered If you have labs (blood work) drawn today and your tests are completely normal, you will receive your results only by: MyChart Message (if you have MyChart) OR A paper copy in the mail If you have any lab test that is abnormal or we need to change your treatment, we will call you to review the results.   Testing/Procedures: None ordered   Follow-Up: At [facility], you and your health needs are our priority.  As part of our continuing mission to provide you with exceptional heart care, we have created designated Provider Care Teams.  These Care Teams include your primary Cardiologist (physician) and Advanced Practice Providers (APPs -  Physician Assistants and Nurse Practitioners) who all work together to provide you with the care you need, when you need it.  We recommend signing up for the patient portal called "MyChart".  Sign up information is provided on this After Visit Summary.  MyChart is used to connect with patients for Virtual Visits (Telemedicine).  Patients are able to view lab/test results, encounter  notes, upcoming appointments, etc.  Non-urgent messages can be sent to your provider as well.   To learn more about what you can do with MyChart, go to [email or web address].    Your next appointment:   6 month(s)  The format for your next appointment:   In Person  Provider:   You may see Debbe Odea, MD or one of the following Advanced Practice Providers on your designated Care Team:   Nicolasa Ducking, NP Eula Listen, PA-C Marisue Ivan, PA-C Cadence [place], [place]   Other Instructions    Signed, Debbe Odea, MD  08/27/2020 5:07 PM    [facility]

## 2020-08-27 NOTE — Patient Instructions (Signed)
Medication Instructions:  Your physician recommends that you continue on your current medications as directed. Please refer to the Current Medication list given to you today.  *If you need a refill on your cardiac medications before your next appointment, please call your pharmacy*   Lab Work: None ordered If you have labs (blood work) drawn today and your tests are completely normal, you will receive your results only by: MyChart Message (if you have MyChart) OR A paper copy in the mail If you have any lab test that is abnormal or we need to change your treatment, we will call you to review the results.   Testing/Procedures: None ordered   Follow-Up: At [facility], you and your health needs are our priority.  As part of our continuing mission to provide you with exceptional heart care, we have created designated Provider Care Teams.  These Care Teams include your primary Cardiologist (physician) and Advanced Practice Providers (APPs -  Physician Assistants and Nurse Practitioners) who all work together to provide you with the care you need, when you need it.  We recommend signing up for the patient portal called "MyChart".  Sign up information is provided on this After Visit Summary.  MyChart is used to connect with patients for Virtual Visits (Telemedicine).  Patients are able to view lab/test results, encounter notes, upcoming appointments, etc.  Non-urgent messages can be sent to your provider as well.   To learn more about what you can do with MyChart, go to [email or web address].    Your next appointment:   6 month(s)  The format for your next appointment:   In Person  Provider:   You may see Brian Agbor-Etang, MD or one of the following Advanced Practice Providers on your designated Care Team:   Christopher Berge, NP Ryan Dunn, PA-C Jacquelyn Visser, PA-C Cadence Furth, PA-C   Other Instructions   

## 2020-09-03 ENCOUNTER — Ambulatory Visit: Payer: BC Managed Care – PPO | Admitting: Cardiology

## 2020-10-02 ENCOUNTER — Ambulatory Visit
Admission: RE | Admit: 2020-10-02 | Discharge: 2020-10-02 | Disposition: A | Discharge status: Home / Self Care | Payer: BC Managed Care – PPO | Source: Ambulatory Visit | Attending: Internal Medicine | Admitting: Internal Medicine

## 2020-10-02 ENCOUNTER — Ambulatory Visit: Payer: BC Managed Care – PPO | Admitting: Internal Medicine

## 2020-10-02 ENCOUNTER — Encounter: Payer: Self-pay | Admitting: Internal Medicine

## 2020-10-02 ENCOUNTER — Other Ambulatory Visit: Payer: Self-pay

## 2020-10-02 DIAGNOSIS — R058 Other specified cough: Secondary | ICD-10-CM | POA: Insufficient documentation

## 2020-10-02 DIAGNOSIS — R0683 Snoring: Secondary | ICD-10-CM | POA: Insufficient documentation

## 2020-10-02 DIAGNOSIS — R0789 Other chest pain: Secondary | ICD-10-CM | POA: Diagnosis not present

## 2020-10-02 DIAGNOSIS — G4733 Obstructive sleep apnea (adult) (pediatric): Secondary | ICD-10-CM | POA: Insufficient documentation

## 2020-10-02 NOTE — Assessment & Plan Note (Signed)
Referred to sleep medicine 10/02/2020 >>>   Main problem with sleep presently is the cough so will work on that 1st and refer to sleep medicine at her request but no need to do the study now.   Discussed in detail all the  indications, usual  risks and alternatives  relative to the benefits with patient who agrees to proceed with w/u as outlined.            Each maintenance medication was reviewed in detail including emphasizing most importantly the difference between maintenance and prns and under what circumstances the prns are to be triggered using an action plan format where appropriate.  Total time for H and P, chart review, counseling,  and generating customized AVS unique to this office visit / same day charting > 45 min

## 2020-10-02 NOTE — Assessment & Plan Note (Addendum)
Onset around 2017 assoc with sob  - max gerd rx 10/02/2020  Then  Add 1st gen H1 blockers per guidelines  If not improving    Upper airway cough syndrome (previously labeled PNDS),  is so named because it's frequently impossible to sort out how much is  CR/sinusitis with freq throat clearing (which can be related to primary GERD)   vs  causing  secondary (" extra esophageal")  GERD from wide swings in gastric pressure that occur with throat clearing, often  promoting self use of mint and menthol lozenges that reduce the lower esophageal sphincter tone and exacerbate the problem further in a cyclical fashion.   These are the same pts (now being labeled as having "irritable larynx syndrome" by some cough centers) who not infrequently have a history of having failed to tolerate ace inhibitors,  dry powder inhalers or biphosphonates or report having atypical/extraesophageal reflux symptoms that don't respond to standard doses of PPI  and are easily confused as having aecopd or asthma flares by even experienced allergists/ pulmonologists (myself included).    >>> rec max gerd rx plus if needed add 1st gen H1 blockers per guidelines  And f/u here if not better, with sleep medicine if responding to rx

## 2020-10-02 NOTE — Progress Notes (Signed)
Claire Miller, female    DOB: 1960/10/30,  MRN: [id number]   Brief patient profile:  66 yowf  never smoker  referred to pulmonary clinic in [facility]  10/02/2020 by Dr   Claire Miller for cp/sob and ? OSA  Wt at baseline p 3 pregnancy in 1990  = 168   Onset cp/sob were at least 5 y prior to first pulmonary eval eg around 2017    History of Present Illness  10/02/2020  Pulmonary/ 1st office eval/ Claire Miller / [facility]  Chief Complaint  Patient presents with   Consult    Pt states cx discomfort, loud snoring.  Dyspnea:  can't keep up  long distances at nl pace or do / also limited by knee L  so MMRC2 = can't walk a nl pace on a flat grade s sob but does fine slow and flat eg  Cough: esp in am's x sev hours each am x  one year min mucoid  Sleep: on side / bed is flat several years,  wakes coughing p 5-6 x per night  then back to bed, loud snoring  SABA use: none  Cp is better now x one month / more likely sitting, almost never lying migrates ant side to side  Overt HB despite ppi bid and pepcid but using lots of mints   No obvious other patterns in day to day or daytime variability or assoc excess/ purulent sputum or mucus plugs or hemoptysis or chest tightness, subjective wheeze or overt sinus   symptoms.    Also denies any obvious fluctuation of symptoms with weather or environmental changes or other aggravating or alleviating factors except as outlined above   No unusual exposure hx or h/o childhood pna/ asthma or knowledge of premature birth.  Current Allergies, Complete Past Medical History, Past Surgical History, Family History, and Social History were reviewed in [employer] record.  ROS  The following are not active complaints unless bolded Hoarseness, sore throat, dysphagia, dental problems, itching, sneezing,  nasal congestion or discharge of excess mucus or purulent secretions, ear ache,   fever, chills, sweats, unintended wt loss or wt gain, classically  pleuritic or exertional cp,  orthopnea pnd or arm/hand swelling  or leg swelling, presyncope, palpitations, abdominal pain, anorexia, nausea, vomiting, diarrhea  or change in bowel habits or change in bladder habits, change in stools or change in urine, dysuria, hematuria,  rash, arthralgias, visual complaints, headache, numbness, weakness or ataxia or problems with walking or coordination,  change in mood or  memory.            Past Medical History:  Diagnosis Date   Chest pain    a. 03/2019 Cor CTA: Nl cors. cor Ca2+ = 13.7 (73rd %'ile). No significant non-cardiac findings.   Complication of anesthesia 2009   woke up during foot surgery    Diastolic dysfunction    a. 03/2019 Echo: EF 60-65%, no rwma, Gr1 DD, nl RV fxn.   Gastric polyps    Gastritis    Headache    Hiatal hernia    History of kidney stones    Hyperlipidemia    Hypertension    Kidney stones     Outpatient Medications Prior to Visit  Medication Sig Dispense Refill   aspirin EC 81 MG tablet Take 81 mg by mouth daily. Swallow whole.     cholecalciferol (VITAMIN D3) 25 MCG (1000 UNIT) tablet Take 1,000 Units by mouth at bedtime.     famotidine (  PEPCID) 20 MG tablet Take 20 mg by mouth 2 (two) times daily as needed for heartburn or indigestion.     ibuprofen (ADVIL) 200 MG tablet Take 400 mg by mouth every 8 (eight) hours as needed (for pain.).     Magnesium 200 MG TABS Take by mouth daily at 2 am.     metoprolol succinate (TOPROL-XL) 100 MG 24 hr tablet Take 100 mg by mouth at bedtime.      omeprazole (PRILOSEC) 20 MG capsule Take 20 mg by mouth 2 (two) times daily before a meal.     rosuvastatin (CRESTOR) 5 MG tablet Take 5 mg by mouth at bedtime.      Ascorbic Acid (VITAMIN C GUMMIE PO) Take by mouth daily at 8 pm. (Patient not taking: Reported on 10/02/2020)     Biotin 5000 MCG TABS Take by mouth. (Patient not taking: Reported on 10/02/2020)     losartan (COZAAR) 50 MG tablet Take 1 tablet (50 mg total) by mouth daily. 30  tablet 9   Multiple Vitamin (MULTIVITAMIN WITH MINERALS) TABS tablet Take 1 tablet by mouth at bedtime. (Patient not taking: Reported on 10/02/2020)     No facility-administered medications prior to visit.     Objective:     BP 110/82 (BP Location: Left Arm, Patient Position: Sitting, Cuff Size: Large)   Pulse 72   Temp (!) 97.1 F (36.2 C) (Temporal)   Ht 5\' 6"  (1.676 m)   Wt 230 lb 3.2 oz (104.4 kg)   SpO2 96% Comment: ra  BMI 37.16 kg/m   SpO2: 96 % (ra)  Pleasant amb obese wf nad   HEENT : pt wearing mask not removed for exam due to covid -19 concerns.    NECK :  without JVD/Nodes/TM/ nl carotid upstrokes bilaterally   LUNGS: no acc muscle use,  Nl contour chest which is clear to A and P bilaterally without cough on insp or exp maneuvers   CV:  RRR  no s3 or murmur or increase in P2, and no edema   ABD:  soft and nontender with nl inspiratory excursion in the supine position. No bruits or organomegaly appreciated, bowel sounds nl  MS:  Nl gait/ ext warm without deformities, calf tenderness, cyanosis or clubbing No obvious joint restrictions   SKIN: warm and dry without lesions    NEURO:  alert, approp, nl sensorium with  no motor or cerebellar deficits apparent.     CXR PA and Lateral:   10/02/2020 :    I personally reviewed images and agree with radiology impression as follows:     CT chest cuts on CT coronary 03/31/19 Limited view of the lung parenchyma demonstrates no suspicious nodularity. Airways are normal.     Assessment   Upper airway cough syndrome Onset around 2017 assoc with sob  - max gerd rx 10/02/2020  Then  Add 1st gen H1 blockers per guidelines  If not improving    Upper airway cough syndrome (previously labeled PNDS),  is so named because it's frequently impossible to sort out how much is  CR/sinusitis with freq throat clearing (which can be related to primary GERD)   vs  causing  secondary (" extra esophageal")  GERD from wide swings in  gastric pressure that occur with throat clearing, often  promoting self use of mint and menthol lozenges that reduce the lower esophageal sphincter tone and exacerbate the problem further in a cyclical fashion.   These are the same pts (now being  labeled as having "irritable larynx syndrome" by some cough centers) who not infrequently have a history of having failed to tolerate ace inhibitors,  dry powder inhalers or biphosphonates or report having atypical/extraesophageal reflux symptoms that don't respond to standard doses of PPI  and are easily confused as having aecopd or asthma flares by even experienced allergists/ pulmonologists (myself included).    >>> rec max gerd rx plus if needed add 1st gen H1 blockers per guidelines  And f/u here if not better, with sleep medicine if responding to rx     Atypical chest pain Onset around 2017 assoc with sob  - CT coronary neg 03/31/19  - rx as IBS 10/02/2020 >>>  Classic chronic relapsing  atypical chest pain pattern suggests ibs:  Stereotypical, migratory with a very limited distribution of pain locations, daytime, not usually exacerbated by exercise  or coughing, worse in sitting position, frequently associated with generalized abd bloating, not as likely to be present supine due to the dome effect of the diaphragm which  is  canceled in that position. Frequently these patients have had multiple negative GI workups and CT scans.  Treatment consists of avoiding foods that cause gas (especially boiled eggs, mexcican food but especially  beans and undercooked vegetables like  spinach and some salads)  and citrucel 1 heaping tsp twice daily with a large glass of water.  Pain should improve w/in 2 weeks and if not then consider further GI work up.       Snoring Referred to sleep medicine 10/02/2020 >>>   Main problem with sleep presently is the cough so will work on that 1st and refer to sleep medicine at her request but no need to do the study now.    Discussed in detail all the  indications, usual  risks and alternatives  relative to the benefits with patient who agrees to proceed with w/u as outlined.         Each maintenance medication was reviewed in detail including emphasizing most importantly the difference between maintenance and prns and under what circumstances the prns are to be triggered using an action plan format where appropriate.  Total time for H and P, chart review, counseling,  and generating customized AVS unique to this office visit / same day charting > 45 min           Sandrea Hughs, MD 10/02/2020

## 2020-10-02 NOTE — Patient Instructions (Addendum)
Classic pain pattern suggests ibs:  Stereotypical, migratory with a very limited distribution of pain locations, daytime, not usually exacerbated by exercise  or coughing, worse in sitting position, frequently associated with generalized abd bloating, not as likely to be present supine due to the dome effect of the diaphragm which  is  canceled in that position. Frequently these patients have had multiple negative GI workups and CT scans.  Treatment consists of avoiding foods that cause gas (especially boiled eggs, [place] food but especially  beans and undercooked vegetables like  spinach and some salads)  and citrucel 1 heaping tsp twice daily with a large glass of water.  Pain should improve w/in 2 weeks and if not then consider further GI work up.     Omeprazole should be total of 40 mg Take 30-60 min before first meal of the day and pepcid (famotidine) 20 mg after supper.   GERD (REFLUX)  is an extremely common cause of respiratory symptoms just like yours , many times with no obvious heartburn at all.    It can be treated with medication, but also with lifestyle changes including elevation of the head of your bed (ideally with 6 -8inch blocks under the headboard of your bed),  Smoking cessation, avoidance of late meals, excessive alcohol, and avoid fatty foods, chocolate, peppermint, colas, red wine, and acidic juices such as orange juice.  NO MINT OR MENTHOL PRODUCTS SO NO COUGH DROPS  USE SUGARLESS CANDY INSTEAD (Jolley ranchers or Stover's or Life Savers) or even ice chips will also do - the key is to swallow to prevent all throat clearing. NO OIL BASED VITAMINS - use powdered substitutes.  Avoid fish oil when coughing.   If not happy you're  sleeping better or your morning cough >>  try take CHLORPHENIRAMINE  4 mg  (Chlortab 4mg   at should be easiest to find in the green box)  take one every 4 hours as needed - available over the counter- may cause drowsiness so start with  just a dose or two an hour before bedtime and see how you tolerate it before trying in daytime     We will set you up with the earliest sleep medicine doctor either here or in [place].

## 2020-10-02 NOTE — Assessment & Plan Note (Signed)
Onset around 2017 assoc with sob  - CT coronary neg 03/31/19  - rx as IBS 10/02/2020 >>>  Classic chronic relapsing  atypical chest pain pattern suggests ibs:  Stereotypical, migratory with a very limited distribution of pain locations, daytime, not usually exacerbated by exercise  or coughing, worse in sitting position, frequently associated with generalized abd bloating, not as likely to be present supine due to the dome effect of the diaphragm which  is  canceled in that position. Frequently these patients have had multiple negative GI workups and CT scans.  Treatment consists of avoiding foods that cause gas (especially boiled eggs, mexcican food but especially  beans and undercooked vegetables like  spinach and some salads)  and citrucel 1 heaping tsp twice daily with a large glass of water.  Pain should improve w/in 2 weeks and if not then consider further GI work up.

## 2020-10-22 ENCOUNTER — Telehealth: Payer: Self-pay

## 2020-10-22 NOTE — Telephone Encounter (Signed)
Left message trying to clarify if ever had sleep study done.

## 2020-10-23 ENCOUNTER — Institutional Professional Consult (permissible substitution): Payer: BC Managed Care – PPO | Admitting: Adult Health

## 2020-10-23 ENCOUNTER — Encounter: Payer: Self-pay | Admitting: Adult Health

## 2020-10-23 ENCOUNTER — Other Ambulatory Visit: Payer: Self-pay

## 2020-10-23 ENCOUNTER — Ambulatory Visit: Payer: BC Managed Care – PPO | Admitting: Adult Health

## 2020-10-23 VITALS — BP 130/60 | HR 97 | Temp 97.5°F | Ht 65.5 in | Wt 229.0 lb

## 2020-10-23 DIAGNOSIS — R058 Other specified cough: Secondary | ICD-10-CM | POA: Diagnosis not present

## 2020-10-23 DIAGNOSIS — R059 Cough, unspecified: Secondary | ICD-10-CM | POA: Diagnosis not present

## 2020-10-23 DIAGNOSIS — R0683 Snoring: Secondary | ICD-10-CM | POA: Diagnosis not present

## 2020-10-23 MED ORDER — BENZONATATE 200 MG PO CAPS: 200.0000 mg | ORAL_CAPSULE | Freq: Three times a day (TID) | ORAL | 3 refills | Status: DC | PRN

## 2020-10-23 NOTE — Progress Notes (Signed)
@Patient  ID: , female    DOB: 1960-10-28, 60 y.o.   MRN: [id number]  Chief Complaint  Patient presents with   Follow-up    Referring provider: [id number], DO  HPI: 60 year old female never smoker seen for pulmonary consult October 02, 2020 for cough, chest tightness and shortness of breath Medical history significant for diastolic dysfunction and hypertension   TEST/EVENTS :  2D echo March 28, 2019 normal systolic function, mild LVH, grade 1 diastolic dysfunction, RVSF is normal  CT coronary neg 03/31/19   10/23/2020 Follow up : Cough/ Daytime sleepiness  Patient returns for 1 month follow-up.  Patient was seen last month for a pulmonary consult for cough x5 years with associated shortness of breath and chest tightness.  She was placed on GERD treatment with omeprazole and Pepcid along with Chlor tabs 4mg  At bedtime . Has not seen a lot of difference in cough . Did develop a Bronchitis since last ov , treated with Zpack by PCP . Congestion better but cough is not changed. Taking Delsym for cough  with some relief.  Patient has been seen by cardiology for atypical chest pain with essentially a negative cardiac work-up.  Patient complains of daytime sleepiness and snoring, restless sleep. Spouse says her snoring keeps him up at night, has to sleep in another room. . Goes to bed 10 pm , up at 6 am . Wakes up 5 times a night. Very restless.  Works at 10/25/2020.  Epworth score 13.  No sedating meds. No symptoms suspicious for sleep paralysis , cataplexy.  Watches tv piror to bed.  Drinks 3 cups of caffeine daily .  Never smoker . 3 kids , 9 grandkids .  Not active .  Very sleepy during daytime, falls asleep easily . .Grinds teeth , has mouth guard.  No CHF or CVA history .   Med history HTN and Hyperlipidemia , SH : gallbladder , back surgery , right knee surgery .     Allergies  Allergen Reactions   Sulfa Antibiotics Swelling     There is no immunization history  on file for this patient.  Past Medical History:  Diagnosis Date   Chest pain    a. 03/2019 Cor CTA: Nl cors. cor Ca2+ = 13.7 (73rd %'ile). No significant non-cardiac findings.   Complication of anesthesia 2009   woke up during foot surgery    Diastolic dysfunction    a. 03/2019 Echo: EF 60-65%, no rwma, Gr1 DD, nl RV fxn.   Gastric polyps    Gastritis    Headache    Hiatal hernia    History of kidney stones    Hyperlipidemia    Hypertension    Kidney stones     Tobacco History: Social History   Tobacco Use  Smoking Status Never  Smokeless Tobacco Never   Counseling given: Not Answered   Outpatient Medications Prior to Visit  Medication Sig Dispense Refill   aspirin EC 81 MG tablet Take 81 mg by mouth daily. Swallow whole.     cholecalciferol (VITAMIN D3) 25 MCG (1000 UNIT) tablet Take 1,000 Units by mouth at bedtime.     famotidine (PEPCID) 20 MG tablet Take 20 mg by mouth 2 (two) times daily as needed for heartburn or indigestion.     ibuprofen (ADVIL) 200 MG tablet Take 400 mg by mouth every 8 (eight) hours as needed (for pain.).     losartan (COZAAR) 50 MG tablet Take 1 tablet (50 mg total) by  mouth daily. 30 tablet 9   Magnesium 200 MG TABS Take by mouth daily at 2 am.     metoprolol succinate (TOPROL-XL) 100 MG 24 hr tablet Take 100 mg by mouth at bedtime.      omeprazole (PRILOSEC) 20 MG capsule Take 20 mg by mouth 2 (two) times daily before a meal.     rosuvastatin (CRESTOR) 5 MG tablet Take 5 mg by mouth at bedtime.      No facility-administered medications prior to visit.     Review of Systems:   Constitutional:   No  weight loss, night sweats,  Fevers, chills, fatigue, or  lassitude.  HEENT:   No headaches,  Difficulty swallowing,  Tooth/dental problems, or  Sore throat,                No sneezing, itching, ear ache, nasal congestion, post nasal drip,   CV:  No chest pain,  Orthopnea, PND, swelling in lower extremities, anasarca, dizziness, palpitations,  syncope.   GI  No heartburn, indigestion, abdominal pain, nausea, vomiting, diarrhea, change in bowel habits, loss of appetite, bloody stools.   Resp: .  No chest wall deformity  Skin: no rash or lesions.  GU: no dysuria, change in color of urine, no urgency or frequency.  No flank pain, no hematuria   MS:  No joint pain or swelling.  No decreased range of motion.  No back pain.    Physical Exam   GEN: A/Ox3; pleasant , NAD, well nourished    HEENT:  [place]/AT,  l, NOSE-clear, THROAT-clear, no lesions, no postnasal drip or exudate noted.   NECK:  Supple w/ fair ROM; no JVD; normal carotid impulses w/o bruits; no thyromegaly or nodules palpated; no lymphadenopathy.    RESP  Clear  P & A; w/o, wheezes/ rales/ or rhonchi. no accessory muscle use, no dullness to percussion  CARD:  RRR, no m/r/g, no peripheral edema, pulses intact, no cyanosis or clubbing.  GI:   Soft & nt; nml bowel sounds; no organomegaly or masses detected.   Musco: Warm bil, no deformities or joint swelling noted.   Neuro: alert, no focal deficits noted.    Skin: Warm, no lesions or rashes    Lab Results:  CBC   BMET   BNP No results found for: BNP  ProBNP No results found for: PROBNP  Imaging: DG Chest 2 View  Result Date: 10/02/2020 CLINICAL DATA:  60 year old female with history of persistent cough. EXAM: CHEST - 2 VIEW COMPARISON:  Chest x-ray 03/03/2020. FINDINGS: Lung volumes are normal. No consolidative airspace disease. No pleural effusions. No pneumothorax. No pulmonary nodule or mass noted. Pulmonary vasculature and the cardiomediastinal silhouette are within normal limits. IMPRESSION: No radiographic evidence of acute cardiopulmonary disease. Electronically Signed   By: Trudie Reed M.D.   On: 10/02/2020 20:17      No flowsheet data found.  No results found for: NITRICOXIDE      Assessment & Plan:   No problem-specific Assessment & Plan notes found for this  encounter.     Rubye Oaks, NP 10/23/2020

## 2020-10-23 NOTE — Patient Instructions (Addendum)
Set up for home sleep study . (Once results back will set up follow up )  Healthy sleep regimen .  Work on healthy weight loss.   Delsym 2 tsp Twice daily  for cough As needed   Add Tessalon Three times a day  for cough As needed   Add Chlortab 4mg  At bedtime  .  Continue on Prilosec and Pepcid .   Follow up in 6 weeks with PFT with Dr. . And As needed    Please contact office for sooner follow up if symptoms do not improve or worsen or seek emergency care

## 2020-10-25 NOTE — Assessment & Plan Note (Signed)
Chronic cough questionable etiology.  Continue on cough suppression regimen along with trigger prevention Check PFTs on return  Plan  Patient Instructions  Set up for home sleep study . (Once results back will set up follow up )  Healthy sleep regimen .  Work on healthy weight loss.   Delsym 2 tsp Twice daily  for cough As needed   Add Tessalon Three times a day  for cough As needed   Add Chlortab 4mg  At bedtime  .  Continue on Prilosec and Pepcid .   Follow up in 6 weeks with PFT with Dr. . And As needed    Please contact office for sooner follow up if symptoms do not improve or worsen or seek emergency care

## 2020-10-25 NOTE — Assessment & Plan Note (Signed)
Daytime sleepiness and snoring suspicious for underlying sleep apnea.  Set up home sleep study Patient education on healthy sleep regimen  Plan  Patient Instructions  Set up for home sleep study . (Once results back will set up follow up )  Healthy sleep regimen .  Work on healthy weight loss.   Delsym 2 tsp Twice daily  for cough As needed   Add Tessalon Three times a day  for cough As needed   Add Chlortab 4mg  At bedtime  .  Continue on Prilosec and Pepcid .   Follow up in 6 weeks with PFT with Dr. . And As needed    Please contact office for sooner follow up if symptoms do not improve or worsen or seek emergency care

## 2020-12-07 ENCOUNTER — Other Ambulatory Visit: Admission: RE | Admit: 2020-12-07 | Payer: BC Managed Care – PPO | Source: Ambulatory Visit

## 2020-12-07 ENCOUNTER — Ambulatory Visit: Payer: BC Managed Care – PPO

## 2020-12-10 ENCOUNTER — Ambulatory Visit: Payer: BC Managed Care – PPO | Admitting: Internal Medicine

## 2020-12-20 ENCOUNTER — Telehealth: Payer: Self-pay

## 2020-12-20 NOTE — Telephone Encounter (Signed)
Called and spoke to patient bout upcoming Covid test and PFT. Patient had a clear understanding, nothing further needed.

## 2020-12-24 ENCOUNTER — Other Ambulatory Visit: Payer: BC Managed Care – PPO

## 2020-12-25 ENCOUNTER — Other Ambulatory Visit: Payer: Self-pay

## 2020-12-25 ENCOUNTER — Other Ambulatory Visit
Admission: RE | Admit: 2020-12-25 | Discharge: 2020-12-25 | Disposition: A | Discharge status: Home / Self Care | Payer: BC Managed Care – PPO | Source: Ambulatory Visit | Attending: Internal Medicine | Admitting: Internal Medicine

## 2020-12-25 ENCOUNTER — Other Ambulatory Visit: Payer: BC Managed Care – PPO

## 2020-12-25 ENCOUNTER — Ambulatory Visit: Payer: BC Managed Care – PPO

## 2020-12-25 DIAGNOSIS — R059 Cough, unspecified: Secondary | ICD-10-CM

## 2020-12-25 DIAGNOSIS — Z20822 Contact with and (suspected) exposure to covid-19: Secondary | ICD-10-CM | POA: Diagnosis not present

## 2020-12-25 LAB — SARS CORONAVIRUS 2 (TAT 6-24 HRS): SARS Coronavirus 2: NEGATIVE

## 2021-01-01 ENCOUNTER — Other Ambulatory Visit: Payer: Self-pay

## 2021-01-01 DIAGNOSIS — R059 Cough, unspecified: Secondary | ICD-10-CM

## 2021-01-11 ENCOUNTER — Ambulatory Visit (INDEPENDENT_AMBULATORY_CARE_PROVIDER_SITE_OTHER): Payer: BC Managed Care – PPO

## 2021-01-11 ENCOUNTER — Other Ambulatory Visit
Admission: RE | Admit: 2021-01-11 | Discharge: 2021-01-11 | Disposition: A | Discharge status: Home / Self Care | Payer: BC Managed Care – PPO | Source: Ambulatory Visit | Attending: Adult Health | Admitting: Adult Health

## 2021-01-11 ENCOUNTER — Other Ambulatory Visit: Payer: Self-pay

## 2021-01-11 ENCOUNTER — Ambulatory Visit (HOSPITAL_BASED_OUTPATIENT_CLINIC_OR_DEPARTMENT_OTHER): Payer: BC Managed Care – PPO

## 2021-01-11 DIAGNOSIS — Z20822 Contact with and (suspected) exposure to covid-19: Secondary | ICD-10-CM | POA: Insufficient documentation

## 2021-01-11 DIAGNOSIS — R059 Cough, unspecified: Secondary | ICD-10-CM | POA: Insufficient documentation

## 2021-01-11 DIAGNOSIS — G4733 Obstructive sleep apnea (adult) (pediatric): Secondary | ICD-10-CM | POA: Diagnosis not present

## 2021-01-11 DIAGNOSIS — R0683 Snoring: Secondary | ICD-10-CM

## 2021-01-11 LAB — PULMONARY FUNCTION TEST ARMC ONLY
DL/VA % pred: 104 %
DL/VA: 4.33 ml/min/mmHg/L
DLCO unc % pred: 95 %
DLCO unc: 19.87 ml/min/mmHg
FEF 25-75 Post: 2.06 L/sec
FEF 25-75 Pre: 3.18 L/sec
FEF2575-%Change-Post: -35 %
FEF2575-%Pred-Post: 88 %
FEF2575-%Pred-Pre: 136 %
FEV1-%Change-Post: -10 %
FEV1-%Pred-Post: 89 %
FEV1-%Pred-Pre: 99 %
FEV1-Post: 2.32 L
FEV1-Pre: 2.58 L
FEV1FVC-%Change-Post: -6 %
FEV1FVC-%Pred-Pre: 108 %
FEV6-%Change-Post: -2 %
FEV6-%Pred-Post: 90 %
FEV6-%Pred-Pre: 93 %
FEV6-Post: 2.95 L
FEV6-Pre: 3.04 L
FEV6FVC-%Pred-Post: 103 %
FEV6FVC-%Pred-Pre: 103 %
FVC-%Change-Post: -3 %
FVC-%Pred-Post: 87 %
FVC-%Pred-Pre: 90 %
FVC-Post: 2.95 L
FVC-Pre: 3.06 L
Post FEV1/FVC ratio: 79 %
Post FEV6/FVC ratio: 100 %
Pre FEV1/FVC ratio: 84 %
Pre FEV6/FVC Ratio: 100 %
RV % pred: 83 %
RV: 1.73 L
TLC % pred: 98 %
TLC: 5.13 L

## 2021-01-11 LAB — SARS CORONAVIRUS 2 BY RT PCR (HOSPITAL ORDER, PERFORMED IN ~~LOC~~ HOSPITAL LAB): SARS Coronavirus 2: NEGATIVE

## 2021-01-11 MED ORDER — ALBUTEROL SULFATE (2.5 MG/3ML) 0.083% IN NEBU
2.5000 mg | INHALATION_SOLUTION | Freq: Once | RESPIRATORY_TRACT | Status: AC
  Administered 2021-01-11: 2.5 mg via RESPIRATORY_TRACT
  Filled 2021-01-11: qty 3

## 2021-01-18 DIAGNOSIS — G4733 Obstructive sleep apnea (adult) (pediatric): Secondary | ICD-10-CM

## 2021-01-28 ENCOUNTER — Telehealth: Payer: Self-pay | Admitting: Adult Health

## 2021-01-28 NOTE — Telephone Encounter (Signed)
Home sleep study done on January 12, 2021 showed moderate obstructive sleep apnea with AHI at 15.8/hour and SPO2 low at 79%.  We will discuss in detail along with treatment options at your follow-up visit in January with Dr. Sherene Sires

## 2021-01-29 ENCOUNTER — Other Ambulatory Visit: Payer: Self-pay

## 2021-01-29 ENCOUNTER — Encounter: Payer: Self-pay | Admitting: Internal Medicine

## 2021-01-29 ENCOUNTER — Ambulatory Visit: Payer: BC Managed Care – PPO | Admitting: Internal Medicine

## 2021-01-29 ENCOUNTER — Other Ambulatory Visit
Admission: RE | Admit: 2021-01-29 | Discharge: 2021-01-29 | Disposition: A | Discharge status: Home / Self Care | Payer: BC Managed Care – PPO | Attending: Internal Medicine | Admitting: Internal Medicine

## 2021-01-29 DIAGNOSIS — R058 Other specified cough: Secondary | ICD-10-CM

## 2021-01-29 DIAGNOSIS — G4733 Obstructive sleep apnea (adult) (pediatric): Secondary | ICD-10-CM | POA: Diagnosis not present

## 2021-01-29 DIAGNOSIS — R0789 Other chest pain: Secondary | ICD-10-CM

## 2021-01-29 LAB — CBC WITH DIFFERENTIAL/PLATELET
Abs Immature Granulocytes: 0.01 10*3/uL (ref 0.00–0.07)
Basophils Absolute: 0 10*3/uL (ref 0.0–0.1)
Basophils Relative: 0 %
Eosinophils Absolute: 0.1 10*3/uL (ref 0.0–0.5)
Eosinophils Relative: 2 %
HCT: 43.7 % (ref 36.0–46.0)
Hemoglobin: 14.8 g/dL (ref 12.0–15.0)
Immature Granulocytes: 0 %
Lymphocytes Relative: 35 %
Lymphs Abs: 1.7 10*3/uL (ref 0.7–4.0)
MCH: 31.2 pg (ref 26.0–34.0)
MCHC: 33.9 g/dL (ref 30.0–36.0)
MCV: 92 fL (ref 80.0–100.0)
Monocytes Absolute: 0.3 10*3/uL (ref 0.1–1.0)
Monocytes Relative: 7 %
Neutro Abs: 2.7 10*3/uL (ref 1.7–7.7)
Neutrophils Relative %: 56 %
Platelets: 193 10*3/uL (ref 150–400)
RBC: 4.75 MIL/uL (ref 3.87–5.11)
RDW: 12.1 % (ref 11.5–15.5)
WBC: 4.8 10*3/uL (ref 4.0–10.5)
nRBC: 0 % (ref 0.0–0.2)

## 2021-01-29 MED ORDER — METOPROLOL SUCCINATE ER 100 MG PO TB24: ORAL_TABLET | ORAL | Status: DC

## 2021-01-29 MED ORDER — PREDNISONE 10 MG PO TABS: ORAL_TABLET | ORAL | 0 refills | Status: DC

## 2021-01-29 MED ORDER — FAMOTIDINE 20 MG PO TABS: ORAL_TABLET | ORAL | 11 refills | Status: AC

## 2021-01-29 MED ORDER — PANTOPRAZOLE SODIUM 40 MG PO TBEC: 40.0000 mg | DELAYED_RELEASE_TABLET | Freq: Every day | ORAL | 2 refills | Status: DC

## 2021-01-29 NOTE — Assessment & Plan Note (Signed)
Onset around 2017 assoc with sob  - CT coronary neg 03/31/19  - rx as IBS 10/02/2020 >>> resolved as of 01/29/2021

## 2021-01-29 NOTE — Telephone Encounter (Signed)
Called patient but she did not answer. Left message for her to call us back. Since the encounter was already closed, I will leave it in triage for follow up.

## 2021-01-29 NOTE — Progress Notes (Signed)
Claire Miller, female    DOB: 1960/04/16,  MRN: [id number]   Brief patient profile:  49 yowf  never smoker  referred to pulmonary clinic in [facility]  10/02/2020 by Dr   Mylo Red for cp/sob and ? OSA  Wt at baseline p 3 pregnancy in 1990  = 168   Onset cp/sob were at least 5 y prior to first pulmonary eval eg around 2017    History of Present Illness  10/02/2020  Pulmonary/ 1st office eval/ Braylon Grenda / [facility]  Chief Complaint  Patient presents with   Consult    Pt states cx discomfort, loud snoring.  Dyspnea:  can't keep up  long distances at nl pace or do / also limited by knee L  so MMRC2 = can't walk a nl pace on a flat grade s sob but does fine slow and flat eg  Cough: esp in am's x sev hours each am x  one year min mucoid  Sleep: on side / bed is flat several years,  wakes coughing p 5-6 x per night  then back to bed, loud snoring  SABA use: none  Cp is better now x one month / more likely sitting, almost never lying migrates ant side to side  Overt HB despite ppi bid and pepcid but using lots of mints  Rec Classic pain pattern suggests ibs:    Treatment consists of avoiding foods that cause gas (especially boiled eggs, [place] food but especially  beans and undercooked vegetables like  spinach and some salads)  and citrucel 1 heaping tsp twice daily with a large glass of water.  Pain should improve w/in 2 weeks and if not then consider further GI work up.    Omeprazole should be total of 40 mg Take 30-60 min before first meal of the day and pepcid (famotidine) 20 mg after supper.  GERD diet reviewed, bed blocks rec  If not happy you're  sleeping better or your morning cough >>  try take CHLORPHENIRAMINE  4 mg    We will set you up with the earliest sleep medicine doctor either here or in [place]> did not happen as of 01/29/2021      01/29/2021  f/u ov/[facility]/ [facility] re: uacs x 2 y,  osa mod   maint on gerd rx   Chief Complaint  Patient presents with    Follow-up    Review PFT-- dry cough in the morning.   Cp resolved on citrucel / gerd rx  Dyspnea:  limited by pain L knee   > sob Cough: non productive can go up to several days s a cough - pattern is worse p 20 min of stirring  In am or after the shower and throat clearing all day  Sleeping: disrupted by cough  SABA use: none  02: none  Covid status:   vax x 1  Not clear she's tried h1 in therapeutic doses   No obvious day to day or daytime variability or assoc excess/ purulent sputum or mucus plugs or hemoptysis or cp or chest tightness, subjective wheeze or overt sinus or hb symptoms.     Also denies any obvious fluctuation of symptoms with weather or environmental changes or other aggravating or alleviating factors except as outlined above   No unusual exposure hx or h/o childhood pna/ asthma or knowledge of premature birth.  Current Allergies, Complete Past Medical History, Past Surgical History, Family History, and Social History were reviewed in [employer]  record.  ROS  The following are not active complaints unless bolded Hoarseness, sore throat, dysphagia, dental problems, itching, sneezing,  nasal congestion or discharge of excess mucus or purulent secretions, ear ache,   fever, chills, sweats, unintended wt loss or wt gain, classically pleuritic or exertional cp,  orthopnea pnd or arm/hand swelling  or leg swelling, presyncope, palpitations, abdominal pain, anorexia, nausea, vomiting, diarrhea  or change in bowel habits or change in bladder habits, change in stools or change in urine, dysuria, hematuria,  rash, arthralgias, visual complaints, headache, numbness, weakness or ataxia or problems with walking or coordination,  change in mood or  memory.        Current Meds  Medication Sig   aspirin EC 81 MG tablet Take 81 mg by mouth daily. Swallow whole.   cholecalciferol (VITAMIN D3) 25 MCG (1000 UNIT) tablet Take 1,000 Units by mouth at bedtime.    famotidine (PEPCID) 20 MG tablet Take 20 mg by mouth 2 (two) times daily as needed for heartburn or indigestion.   ibuprofen (ADVIL) 200 MG tablet Take 400 mg by mouth every 8 (eight) hours as needed (for pain.).   Magnesium 200 MG TABS Take by mouth daily at 2 am.   metoprolol succinate (TOPROL-XL) 100 MG 24 hr tablet Take 100 mg by mouth at bedtime.    rosuvastatin (CRESTOR) 5 MG tablet Take 5 mg by mouth at bedtime.               Past Medical History:  Diagnosis Date   Chest pain    a. 03/2019 Cor CTA: Nl cors. cor Ca2+ = 13.7 (73rd %'ile). No significant non-cardiac findings.   Complication of anesthesia 2009   woke up during foot surgery    Diastolic dysfunction    a. 03/2019 Echo: EF 60-65%, no rwma, Gr1 DD, nl RV fxn.   Gastric polyps    Gastritis    Headache    Hiatal hernia    History of kidney stones    Hyperlipidemia    Hypertension    Kidney stones        Objective:     Wt Readings from Last 3 Encounters:  01/29/21 226 lb (102.5 kg)  10/23/20 229 lb (103.9 kg)  10/02/20 230 lb 3.2 oz (104.4 kg)      Vital signs reviewed  01/29/2021  - Note at rest 02 sats  96% on RA   General appearance:    amb obes wf nad occ throat clearing    HEENT : pt wearing mask not removed for exam due to covid -19 concerns.    NECK :  without JVD/Nodes/TM/ nl carotid upstrokes bilaterally   LUNGS: no acc muscle use,  Nl contour chest which is clear to A and P bilaterally without cough on insp or exp maneuvers   CV:  RRR  no s3 or murmur or increase in P2, and no edema   ABD:  soft and nontender with nl inspiratory excursion in the supine position. No bruits or organomegaly appreciated, bowel sounds nl  MS:  Nl gait/ ext warm without deformities, calf tenderness, cyanosis or clubbing No obvious joint restrictions   SKIN: warm and dry without lesions    NEURO:  alert, approp, nl sensorium with  no motor or cerebellar deficits apparent.        Assessment

## 2021-01-29 NOTE — Assessment & Plan Note (Addendum)
Onset around 2017 assoc with sob - max gerd rx 10/02/2020  Then  Add 1st gen H1 blockers per guidelines (did not take 4 mg tabs)   - PFTs 01/11/2021 ? Vcd? - Allergy profile 01/29/2021 >  Eos 0. 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   These are the same pts (now being labeled as having "irritable larynx syndrome" by some cough centers) who not infrequently have a history of having failed to tolerate ace inhibitors,  dry powder inhalers or biphosphonates or report having atypical/extraesophageal reflux symptoms that don't respond to standard doses of PPI  and are easily confused as having aecopd or asthma flares by even experienced allergists/ pulmonologists (myself included).   I did rec splitting the metaprolol to take 50 mg bid and Prednisone 10 mg take  4 each am x 2 days,   2 each am x 2 days,  1 each am x 2 days and stop in case there is component of occult asthma here given noct exac of coughing   rx as above then regroup in 6 weeks ? Trial of gabapentin next?

## 2021-01-29 NOTE — Assessment & Plan Note (Signed)
Referred to sleep medicine 10/02/2020 >> - Home sleep study done on January 12, 2021 showed moderate obstructive sleep apnea with AHI at 15.8/hour and SPO2 low at 79%.> cpap titration ordered 01/29/2021          Each maintenance medication was reviewed in detail including emphasizing most importantly the difference between maintenance and prns and under what circumstances the prns are to be triggered using an action plan format where appropriate.  Total time for H and P, chart review, counseling, device(s) and generating customized AVS unique to this office visit / same day charting  > 30 min

## 2021-01-29 NOTE — Patient Instructions (Addendum)
For drainage / throat tickle try take CHLORPHENIRAMINE  4 mg  ("Allergy Relief" 4mg   at [facility] should be easiest to find in the blue box usually on bottom shelf)  take one every 4 hours as needed - extremely effective and inexpensive over the counter- may cause drowsiness so start with just a dose or two an hour before bedtime and see how you tolerate it before trying in daytime.   Bed blocks (risers) 6-8 in   Pantoprazole (protonix) 40 mg   Take  30-60 min before first meal of the day and Pepcid (famotidine)  20 mg after supper until return to office - this is the best way to tell whether stomach acid is contributing to your problem.    Change metaprolol to 100 mg  one half twice daily   Prednisone 10 mg take  4 each am x 2 days,   2 each am x 2 days,  1 each am x 2 days and stop   Please remember to go to the lab department   for your tests - we will call you with the results when they are available.      We will set you up for cpap titration study and follow up with sleep medicine in [place]    Please schedule a follow up office visit in about  6 weeks, call sooner if needed in [place] office

## 2021-01-29 NOTE — Telephone Encounter (Signed)
Pt seen by Dr. Sherene Sires today 01/29/21. Soonest appt for sleep provider at [facility] office is 4/10 (11 weeks from now). Pt was scheduled for an appt on 3/14 in [place] with Dr. Sherene Sires. I believe pt was mistakenly scheduled with Dr. Sherene Sires instead of sleep doc.    Dr. Sherene Sires, please advise if you are ok with pt waiting until 4/10 for sleep consult and if you would like pt to keep OV with you on 3/14. Thanks.

## 2021-01-29 NOTE — Telephone Encounter (Signed)
We are going ahead with cpap titration now but will need long term f/u by sleep medicine so 4/10 is fine and I will see on 3/14 about other issues

## 2021-01-31 LAB — IGE: IgE (Immunoglobulin E), Serum: 99 IU/mL (ref 6–495)

## 2021-03-01 NOTE — Telephone Encounter (Signed)
CPAP titration scheduled for 03/15/21 at 8:00.  Closing encounter, nothing further needed.

## 2021-03-06 ENCOUNTER — Telehealth: Payer: Self-pay | Admitting: Internal Medicine

## 2021-03-06 NOTE — Telephone Encounter (Signed)
Sleep study has been cancelled. Called and spoke with pt letting her know info per MW and she verbalized understanding. Stated to her 1st avail appt in [place] office was not until 5/4. Stated to her 1st avail appt in [place] was tomorrow 3/2. Pt has been scheduled for an appt with CY tomorrow 3/2 at 3:30. Pt made aware of office address. Nothing further needed. ?

## 2021-03-06 NOTE — Telephone Encounter (Signed)
Patient is scheduled to do Cpap Titration Study on 03/15/21. I just got off the phone with her insurance and they have denied the in lab sleep study due to not being medically necessary. Would you want to do Peer to Peer ?

## 2021-03-06 NOTE — Telephone Encounter (Signed)
Cancel study and just set her up with 1st avail sleep medicine doctor for consult for [place] (or [place] if wants to go there)  ?

## 2021-03-07 ENCOUNTER — Ambulatory Visit (INDEPENDENT_AMBULATORY_CARE_PROVIDER_SITE_OTHER): Payer: BC Managed Care – PPO | Admitting: Internal Medicine

## 2021-03-07 ENCOUNTER — Other Ambulatory Visit: Payer: Self-pay

## 2021-03-07 ENCOUNTER — Encounter: Payer: Self-pay | Admitting: Internal Medicine

## 2021-03-07 VITALS — BP 112/82 | HR 77 | Temp 97.7°F | Ht 65.0 in | Wt 234.0 lb

## 2021-03-07 DIAGNOSIS — G4733 Obstructive sleep apnea (adult) (pediatric): Secondary | ICD-10-CM | POA: Diagnosis not present

## 2021-03-07 DIAGNOSIS — I10 Essential (primary) hypertension: Secondary | ICD-10-CM

## 2021-03-07 NOTE — Progress Notes (Signed)
03/07/21- 38 yoF never smoker for sleep evaluation courtesy of Dr Sherene Sires with concern of OSA ?Medical problem list includes HTN, [place] Cough, Hyperlipidemia, Diastolic Dysfunction, GERD, ?HST-01/12/21- AHI 15.8/ hr, desaturation to 79% (mean 90%), body weight 229 lbs ?Epworth score- ?Body weight today- ?Covid vax-2 Phizer ?Flu vax-no ?Had complained of loud snore, restless sleep> husband into separate room, daytime sleepiness. ?1 cup morning coffee. Restless sleep- toss and turn.  ?Denies ENT surgery or lung disease. ?Family hx of OSA- brother and suspect mother.  ? ?Prior to Admission medications   ?Medication Sig Start Date End Date Taking? Authorizing Provider  ?aspirin EC 81 MG tablet Take 81 mg by mouth daily. Swallow whole.   Yes [provider]  ?cholecalciferol (VITAMIN D3) 25 MCG (1000 UNIT) tablet Take 1,000 Units by mouth at bedtime.   Yes [provider]  ?famotidine (PEPCID) 20 MG tablet One after supper 01/29/21  Yes Nyoka Cowden, MD  ?ibuprofen (ADVIL) 200 MG tablet Take 400 mg by mouth every 8 (eight) hours as needed (for pain.).   Yes [provider]  ?Magnesium 200 MG TABS Take by mouth daily at 2 am.   Yes [provider]  ?metoprolol succinate (TOPROL-XL) 100 MG 24 hr tablet One half twice daily 01/29/21  Yes Nyoka Cowden, MD  ?pantoprazole (PROTONIX) 40 MG tablet Take 1 tablet (40 mg total) by mouth daily. Take 30-60 min before first meal of the day 01/29/21  Yes Nyoka Cowden, MD  ?rosuvastatin (CRESTOR) 5 MG tablet Take 5 mg by mouth at bedtime.  10/27/18  Yes [provider]  ?losartan (COZAAR) 50 MG tablet Take 1 tablet (50 mg total) by mouth daily. 05/18/20 08/27/20  Debbe Odea, MD  ?predniSONE (DELTASONE) 10 MG tablet Take  4 each am x 2 days,   2 each am x 2 days,  1 each am x 2 days and stop ?Patient not taking: Reported on 03/07/2021 01/29/21   Nyoka Cowden, MD  ? ?Past Medical History:  ?Diagnosis Date  ? Chest pain   ? a. 03/2019  Cor CTA: Nl cors. cor Ca2+ = 13.7 (73rd %'ile). No significant non-cardiac findings.  ? Complication of anesthesia 2009  ? woke up during foot surgery   ? Diastolic dysfunction   ? a. 03/2019 Echo: EF 60-65%, no rwma, Gr1 DD, nl RV fxn.  ? Gastric polyps   ? Gastritis   ? Headache   ? Hiatal hernia   ? History of kidney stones   ? Hyperlipidemia   ? Hypertension   ? Kidney stones   ? ?Past Surgical History:  ?Procedure Laterality Date  ? BACK SURGERY  01/09/1988  ? CARPAL TUNNEL RELEASE Left   ? CHOLECYSTECTOMY  2012  ? ESOPHAGOGASTRODUODENOSCOPY N/A 04/11/2020  ? Procedure: ESOPHAGOGASTRODUODENOSCOPY (EGD);  Surgeon: Toledo, Boykin Nearing, MD;  Location: [facility] ENDOSCOPY;  Service: Gastroenterology;  Laterality: N/A;  ? FOOT SURGERY Bilateral   ? TOTAL KNEE ARTHROPLASTY Right 06/16/2019  ? Procedure: TOTAL KNEE ARTHROPLASTY;  Surgeon: Christena Flake, MD;  Location: [facility] ORS;  Service: Orthopedics;  Laterality: Right;  ? ?No family history on file. ?Social History  ? ?Socioeconomic History  ? Marital status: Married  ?  Spouse name: Not on file  ? Number of children: Not on file  ? Years of education: Not on file  ? Highest education level: Not on file  ?Occupational History  ? Not on file  ?Tobacco Use  ? Smoking status: Never  ?  Smokeless tobacco: Never  ?Vaping Use  ? Vaping Use: Never used  ?Substance and Sexual Activity  ? Alcohol use: Never  ? Drug use: Never  ? Sexual activity: Not on file  ?Other Topics Concern  ? Not on file  ?Social History Narrative  ? Not on file  ? ?Social Determinants of Health  ? ?Financial Resource Strain: Not on file  ?Food Insecurity: Not on file  ?Transportation Needs: Not on file  ?Physical Activity: Not on file  ?Stress: Not on file  ?Social Connections: Not on file  ?Intimate Partner Violence: Not on file  ? ?ROS-see HPI   +=positive ?Constitutional:    weight loss, night sweats, fevers, chills, fatigue, lassitude. ?HEENT:    headaches, difficulty swallowing, tooth/dental problems, sore  throat,  ?     sneezing, itching, ear ache, nasal congestion, post nasal drip, snoring ?CV:    chest pain, orthopnea, PND, swelling in lower extremities, anasarca,                                  ? dizziness, palpitations ?Resp:   shortness of breath with exertion or at rest.   ?             productive cough,   non-productive cough, coughing up of blood.   ?           change in color of mucus.  wheezing.   ?Skin:    rash or lesions. ?GI:  No-   heartburn, indigestion, abdominal pain, nausea, vomiting, diarrhea,  ?               change in bowel habits, loss of appetite ?GU: dysuria, change in color of urine, no urgency or frequency.   flank pain. ?MS:   joint pain, stiffness, decreased range of motion, back pain. ?Neuro-     nothing unusual ?Psych:  change in mood or affect.  depression or anxiety.   memory loss. ? ?OBJ- Physical Exam ?General- Alert, Oriented, Affect-appropriate, Distress- none acute, +obese ?Skin- rash-none, lesions- none, excoriation- none ?Lymphadenopathy- none ?Head- atraumatic ?           Eyes- Gross vision intact, PERRLA, conjunctivae and secretions clear ?           Ears- Hearing, canals-normal ?           Nose- Clear, no-Septal dev, mucus, polyps, erosion, perforation  ?           Throat- Mallampati IV , mucosa clear , drainage- none, tonsils- atrophic, +teeth ?Neck- flexible , trachea midline, no stridor , thyroid nl, carotid no bruit ?Chest - symmetrical excursion , unlabored ?          Heart/CV- RRR , no murmur , no gallop  , no rub, nl s1 s2 ?                          - JVD- none , edema- none, stasis changes- none, varices- none ?          Lung- clear to P&A, wheeze- none, cough- none , dullness-none, rub- none ?          Chest wall-  ?Abd-  ?Br/ Gen/ Rectal- Not done, not indicated ?Extrem- cyanosis- none, clubbing, none, atrophy- none, strength- nl ?Neuro- grossly intact to observation ? ? ? ?

## 2021-03-07 NOTE — Patient Instructions (Signed)
Order- new DME, new CPAP auto 5-15, mask of choice, humidifier supplies, [employer]/ card ? ?Please call if we can help ?

## 2021-03-08 ENCOUNTER — Telehealth: Payer: Self-pay | Admitting: Internal Medicine

## 2021-03-10 ENCOUNTER — Encounter: Payer: Self-pay | Admitting: Internal Medicine

## 2021-03-10 DIAGNOSIS — I10 Essential (primary) hypertension: Secondary | ICD-10-CM | POA: Insufficient documentation

## 2021-03-10 NOTE — Assessment & Plan Note (Signed)
Appropriate discussion of medical issues, treatment options, safe driving responsibility ?Plan- CPAP auto 5-15 ?

## 2021-03-10 NOTE — Assessment & Plan Note (Signed)
Association of HTN with OSA discussed ?

## 2021-03-12 NOTE — Telephone Encounter (Signed)
I have refaxed to [facility] Fax # [phone] ?

## 2021-03-13 ENCOUNTER — Other Ambulatory Visit: Payer: Self-pay

## 2021-03-13 ENCOUNTER — Telehealth: Payer: Self-pay | Admitting: Internal Medicine

## 2021-03-13 MED ORDER — LOSARTAN POTASSIUM 50 MG PO TABS: 50.0000 mg | ORAL_TABLET | Freq: Every day | ORAL | 9 refills | Status: AC

## 2021-03-15 ENCOUNTER — Encounter: Payer: BC Managed Care – PPO | Admitting: Pulmonary Disease

## 2021-03-15 NOTE — Telephone Encounter (Signed)
I have spoke with McKayla at [facility] and she did get the records that I faxed ?

## 2021-03-19 ENCOUNTER — Ambulatory Visit: Payer: BC Managed Care – PPO | Admitting: Internal Medicine

## 2021-04-05 ENCOUNTER — Institutional Professional Consult (permissible substitution): Payer: BC Managed Care – PPO | Admitting: Pulmonary Disease

## 2021-04-16 NOTE — Telephone Encounter (Signed)
Called and spoke with pt and updated her letting her know that we did take care of faxing all info to [facility] for her and she verbalized understanding. Pt stated that she has not heard from them about receiving a machine so I provided her with phone number so she can reach out to them to get update on when she should receive machine. Nothing further needed. ?

## 2021-04-16 NOTE — Telephone Encounter (Signed)
Called and spoke with pt and updated her letting her know that we did take care of faxing all info to [facility] for her and she verbalized understanding. Pt stated that she has not heard from them about receiving a machine so I provided her with phone number so she can reach out to them to get update on when she should receive machine. Nothing further needed. ?

## 2021-04-22 ENCOUNTER — Ambulatory Visit: Payer: BC Managed Care – PPO | Admitting: Internal Medicine

## 2021-04-22 ENCOUNTER — Encounter: Payer: Self-pay | Admitting: Internal Medicine

## 2021-04-22 DIAGNOSIS — R058 Other specified cough: Secondary | ICD-10-CM

## 2021-04-22 NOTE — Progress Notes (Signed)
? ?Claire Miller, female    DOB: 18-Jan-1960,  MRN: [id number] ? ? ?Brief patient profile:  ?43 yowf  never smoker  referred to pulmonary clinic in [facility]  10/02/2020 by Dr  Mylo Red for cp/sob and ? OSA ? ?Wt at baseline p 3 pregnancy in 1990  = 168  ? ?Onset cp/sob were at least 5 y prior to first pulmonary eval eg around 2017  ? ? ?History of Present Illness  ?10/02/2020  Pulmonary/ 1st office eval/ Claire Miller / [facility] [employer]  Office  ?Chief Complaint  ?Patient presents with  ? Consult  ?  Pt states cx discomfort, loud snoring.  ?Dyspnea:  can't keep up  long distances at nl pace or do / also limited by knee L  so MMRC2 = can't walk a nl pace on a flat grade s sob but does fine slow and flat eg  ?Cough: esp in am's x sev hours each am x  one year min mucoid  ?Sleep: on side / bed is flat several years,  wakes coughing p 5-6 x per night  then back to bed, loud snoring  ?SABA use: none  ?Cp is better now x one month / more likely sitting, almost never lying migrates ant side to side  ?Overt HB despite ppi bid and pepcid but using lots of mints  ?Rec ?Classic pain pattern suggests ibs:    ?Treatment consists of avoiding foods that cause gas (especially boiled eggs, [place] food but especially  beans and undercooked vegetables like  spinach and some salads)  and citrucel 1 heaping tsp twice daily with a large glass of water.  Pain should improve w/in 2 weeks and if not then consider further GI work up.    ?Omeprazole should be total of 40 mg Take 30-60 min before first meal of the day and pepcid (famotidine) 20 mg after supper.  ?GERD diet reviewed, bed blocks rec  ?If not happy you're  sleeping better or your morning cough >>  try take CHLORPHENIRAMINE  4 mg    ?We will set you up with the earliest sleep medicine doctor either here or in [place]> did not happen as of 01/29/2021  ? ?  ? ?01/29/2021  f/u ov/Claire Miller/ [facility] re: uacs x 2 y,  osa mod   maint on gerd rx   ?Chief Complaint  ?Patient presents with  ? Follow-up   ?  Review PFT-- dry cough in the morning.   ?Cp resolved on citrucel / gerd rx  ?Dyspnea:  limited by pain L knee   > sob ?Cough: non productive can go up to several days s a cough - pattern is worse p 20 min of stirring  In am or after the shower and throat clearing all day  ?Sleeping: disrupted by cough  ?SABA use: none  ?02: none  ?Covid status:   vax x 1  ?Not clear she's tried h1 in therapeutic doses ?Rec ?For drainage / throat tickle try take CHLORPHENIRAMINE  4 mg  ?Bed blocks (risers) 6-8 in  ?Pantoprazole (protonix) 40 mg   Take  30-60 min before first meal of the day and Pepcid (famotidine)  20 mg after supper until return to office  ?Change metaprolol to 100 mg  one half twice daily  ?Prednisone 10 mg take  4 each am x 2 days,   2 each am x 2 days,  1 each am x 2 days and stop  ?  ?  ? ?03/07/21 eval by Dr Annamaria Boots  for CPAP ? ? ?04/22/2021  f/u ov/[place] office/Claire Miller re: uacs improved on gerd rx and hs h1    ?Chief Complaint  ?Patient presents with  ? Follow-up  ?  Cough has improved but has come back this morning.   ? Dyspnea:  more limited by L knee pain than sob  ?Cough: sporadic but never noct ?Sleeping: on side / bed risers helping  ?SABA use: none  ?02: none  ?Covid status: vax x 1-2  infected Sept 2021  ?Lung cancer screening: never smoker  ? ? ?No obvious day to day or daytime variability or assoc excess/ purulent sputum or mucus plugs or hemoptysis or cp or chest tightness, subjective wheeze or overt sinus or hb symptoms.  ? ?Sleeping  without nocturnal  or early am exacerbation  of respiratory  c/o's or need for noct saba. Also denies any obvious fluctuation of symptoms with weather or environmental changes or other aggravating or alleviating factors except as outlined above  ? ?No unusual exposure hx or h/o childhood pna/ asthma or knowledge of premature birth. ? ?Current Allergies, Complete Past Medical History, Past Surgical History, Family History, and Social History were reviewed in  [employer] record. ? ?ROS  The following are not active complaints unless bolded ?Hoarseness, sore throat, dysphagia, dental problems, itching, sneezing,  nasal congestion or discharge of excess mucus or purulent secretions, ear ache,   fever, chills, sweats, unintended wt loss or wt gain, classically pleuritic or exertional cp,  orthopnea pnd or arm/hand swelling  or leg swelling, presyncope, palpitations, abdominal pain, anorexia, nausea, vomiting, diarrhea  or change in bowel habits or change in bladder habits, change in stools or change in urine, dysuria, hematuria,  rash, arthralgias, visual complaints, headache, numbness, weakness or ataxia or problems with walking or coordination,  change in mood or  memory. ?      ? ?Current Meds  ?Medication Sig  ? aspirin EC 81 MG tablet Take 81 mg by mouth daily. Swallow whole.  ? cholecalciferol (VITAMIN D3) 25 MCG (1000 UNIT) tablet Take 1,000 Units by mouth at bedtime.  ? famotidine (PEPCID) 20 MG tablet One after supper  ? ibuprofen (ADVIL) 200 MG tablet Take 400 mg by mouth every 8 (eight) hours as needed (for pain.).  ? losartan (COZAAR) 50 MG tablet Take 1 tablet (50 mg total) by mouth daily.  ? Magnesium 200 MG TABS Take by mouth daily at 2 am.  ? metoprolol succinate (TOPROL-XL) 100 MG 24 hr tablet One half twice daily  ? pantoprazole (PROTONIX) 40 MG tablet Take 1 tablet (40 mg total) by mouth daily. Take 30-60 min before first meal of the day  ? rosuvastatin (CRESTOR) 5 MG tablet Take 5 mg by mouth at bedtime.   ?     ? ?  ?  ?    ? ?Past Medical History:  ?Diagnosis Date  ? Chest pain   ? a. 03/2019 Cor CTA: Nl cors. cor Ca2+ = 13.7 (73rd %'ile). No significant non-cardiac findings.  ? Complication of anesthesia 2009  ? woke up during foot surgery   ? Diastolic dysfunction   ? a. 03/2019 Echo: EF 60-65%, no rwma, Gr1 DD, nl RV fxn.  ? Gastric polyps   ? Gastritis   ? Headache   ? Hiatal hernia   ? History of kidney stones   ?  Hyperlipidemia   ? Hypertension   ? Kidney stones   ? ?  ? ? ?Objective:  ?  ? ?  04/22/2021       230   ?01/29/21 226 lb (102.5 kg)  ?10/23/20 229 lb (103.9 kg)  ?10/02/20 230 lb 3.2 oz (104.4 kg)  ?  ?Vital signs reviewed  04/22/2021  - Note at rest 02 sats  98% on RA  ? ?General appearance:    obese amb wm nad/ good voice texture ? ? ? HEENT : nl exam  ? ? ?NECK :  without JVD/Nodes/TM/ nl carotid upstrokes bilaterally ? ? ?LUNGS: no acc muscle use,  Nl contour chest which is clear to A and P bilaterally without cough on insp or exp maneuvers ? ? ?CV:  RRR  no s3 or murmur or increase in P2, and no edema  ? ?ABD:  Obese soft and nontender with nl inspiratory excursion in the supine position. No bruits or organomegaly appreciated, bowel sounds nl ? ?MS:  Nl gait/ ext warm without deformities, calf tenderness, cyanosis or clubbing ?No obvious joint restrictions  ? ?SKIN: warm and dry without lesions   ? ?NEURO:  alert, approp, nl sensorium with  no motor or cerebellar deficits apparent.   ?  ? ?  ? ?  ? ?   ?Assessment  ? ?  ? ?  ?  ?

## 2021-04-22 NOTE — Patient Instructions (Signed)
Try changing in pepcid to 20 mg at bedtime and try chlorpheniramine daytime up to every 4 h for throat tickle ? ?Consider GI evaluation for your reflux ? ?Pulmonary follow up is as needed  ?

## 2021-04-23 ENCOUNTER — Encounter: Payer: Self-pay | Admitting: Internal Medicine

## 2021-04-23 NOTE — Assessment & Plan Note (Signed)
Onset around 2017 assoc with sob ?- max gerd rx 10/02/2020  Then  Add 1st gen H1 blockers per guidelines (did not take 4 mg tabs)   ?- PFTs 01/11/2021 ? Vcd? ?- Allergy profile 01/29/2021 >  Eos 0. 1/  IgE  99 ?- 01/29/2021 try again for high dose H1 esp at hs as" coughs all night" ? ?>>>  resolved as of 04/22/2021 with more of a sporadic daytime cough esp in am but p H1HS does fine noct so ok to try h1 daytime and f/u GI for gerd / pulmonary f/u can be prn  ? ? ?    ?  ? ?Each maintenance medication was reviewed in detail including emphasizing most importantly the difference between maintenance and prns and under what circumstances the prns are to be triggered using an action plan format where appropriate. ? ?Total time for H and P, chart review, counseling  and generating customized AVS unique to this summary final  office visit / same day charting = 23 min ?     ?

## 2021-04-30 ENCOUNTER — Other Ambulatory Visit: Payer: Self-pay | Admitting: Internal Medicine

## 2021-06-11 ENCOUNTER — Ambulatory Visit: Payer: BC Managed Care – PPO | Admitting: Internal Medicine

## 2021-06-20 ENCOUNTER — Ambulatory Visit: Payer: BC Managed Care – PPO | Admitting: Internal Medicine

## 2021-07-19 ENCOUNTER — Ambulatory Visit: Payer: BC Managed Care – PPO | Admitting: Cardiology

## 2021-09-19 ENCOUNTER — Ambulatory Visit: Payer: BC Managed Care – PPO | Attending: Physician Assistant | Admitting: Physician Assistant

## 2021-09-19 ENCOUNTER — Encounter: Payer: Self-pay | Admitting: Physician Assistant

## 2021-09-19 VITALS — BP 134/82 | HR 62 | Ht 66.0 in | Wt 229.6 lb

## 2021-09-19 DIAGNOSIS — E785 Hyperlipidemia, unspecified: Secondary | ICD-10-CM | POA: Diagnosis not present

## 2021-09-19 DIAGNOSIS — G4733 Obstructive sleep apnea (adult) (pediatric): Secondary | ICD-10-CM

## 2021-09-19 DIAGNOSIS — I2510 Atherosclerotic heart disease of native coronary artery without angina pectoris: Secondary | ICD-10-CM | POA: Diagnosis not present

## 2021-09-19 DIAGNOSIS — Z6837 Body mass index (BMI) 37.0-37.9, adult: Secondary | ICD-10-CM | POA: Diagnosis not present

## 2021-09-19 DIAGNOSIS — I10 Essential (primary) hypertension: Secondary | ICD-10-CM | POA: Diagnosis not present

## 2021-09-19 DIAGNOSIS — R519 Headache, unspecified: Secondary | ICD-10-CM

## 2021-09-19 MED ORDER — CARVEDILOL 25 MG PO TABS
25.0000 mg | ORAL_TABLET | Freq: Two times a day (BID) | ORAL | 3 refills | Status: DC
Start: 2021-09-19 — End: 2022-06-10

## 2021-09-19 NOTE — Patient Instructions (Signed)
Medication Instructions:  Your physician has recommended you make the following change in your medication:   STOP Toprol XL (Metoprolol succinate)  START Carvedilol 25 mg twice a day  *If you need a refill on your cardiac medications before your next appointment, please call your pharmacy*   Lab Work: None  If you have labs (blood work) drawn today and your tests are completely normal, you will receive your results only by: MyChart Message (if you have MyChart) OR A paper copy in the mail If you have any lab test that is abnormal or we need to change your treatment, we will call you to review the results.   Testing/Procedures: None   Follow-Up: At [facility], you and your health needs are our priority.  As part of our continuing mission to provide you with exceptional heart care, we have created designated Provider Care Teams.  These Care Teams include your primary Cardiologist (physician) and Advanced Practice Providers (APPs -  Physician Assistants and Nurse Practitioners) who all work together to provide you with the care you need, when you need it.   Your next appointment:   1 month(s)  The format for your next appointment:   In Person  Provider:   Debbe Odea, MD or Eula Listen, PA-C       Important Information About Sugar

## 2021-09-19 NOTE — Addendum Note (Signed)
Addended by: Bryna Colander on: 09/19/2021 01:41 PM   Modules accepted: Orders

## 2021-09-19 NOTE — Progress Notes (Signed)
Cardiology Office Note    Date:  09/19/2021   ID:  Claire Miller, DOB 17-Nov-1960, MRN [id number]  PCP:  Lorelei Pont, DO  Cardiologist:  Debbe Odea, MD  Electrophysiologist:  None   Chief Complaint: Follow-up  History of Present Illness:   Claire Miller is a 61 y.o. female with history of minimal nonobstructive coronary artery calcification by coronary CTA in 03/2019, HTN, HLD, obesity, OSA intolerant to CPAP, GERD, and osteoarthritis who presents for follow-up of her CAD.  She was evaluated for chest pain in 02/2019.  Echo at that time demonstrated an EF of 60 to 65%, no regional wall motion abnormalities, mild LVH, grade 1 diastolic dysfunction, normal RV systolic function and ventricular cavity size, no significant valvular abnormalities, and an estimated right atrial pressure of 3 mmHg.  Coronary CTA in 03/2019 showed no significant extracardiac findings with a calcium score of 13.7 which was the 73rd percentile.  There was minimal coronary artery calcification in the ostial left main.  She was last seen in the office in 08/2020 continuing to note occasional chest discomfort associated with exertion as well as daytime fatigue, sleepiness, and exertional dyspnea.  She has subsequently been diagnosed with sleep apnea.  PFTs in 01/2021 were unrevealing.  She comes in doing well from a cardiac perspective and is without symptoms of angina or decompensation.  Over the past 8 weeks she has had a daily headache.  With this, she has noted elevations in her BP ranging from the 130s to 170s systolic.  In this setting, she is now on Toprol-XL 100 mg daily and losartan 50 mg twice daily.  More recently, she has began to watch her sodium intake.  With regards to her headache, she has taken Tylenol or ibuprofen on a regular basis, sometimes daily.  She is intolerant to CPAP.  Weight remains stable.   Labs independently reviewed: 09/2021 - Hgb 15.4, PLT 213, potassium 3.9, BUN 10, serum creatinine  0.7, albumin 4.8, AST 53, ALT 50, magnesium 1.9 08/2020 - TSH normal 10/2018 - TC 225, TG 146, HDL 50, LDL 146  Past Medical History:  Diagnosis Date   Chest pain    a. 03/2019 Cor CTA: Nl cors. cor Ca2+ = 13.7 (73rd %'ile). No significant non-cardiac findings.   Complication of anesthesia 2009   woke up during foot surgery    Diastolic dysfunction    a. 03/2019 Echo: EF 60-65%, no rwma, Gr1 DD, nl RV fxn.   Gastric polyps    Gastritis    Headache    Hiatal hernia    History of kidney stones    Hyperlipidemia    Hypertension    Kidney stones     Past Surgical History:  Procedure Laterality Date   BACK SURGERY  01/09/1988   CARPAL TUNNEL RELEASE Left    CHOLECYSTECTOMY  2012   ESOPHAGOGASTRODUODENOSCOPY N/A 04/11/2020   Procedure: ESOPHAGOGASTRODUODENOSCOPY (EGD);  Surgeon: Toledo, Boykin Nearing, MD;  Location: [facility];  Service: Gastroenterology;  Laterality: N/A;   FOOT SURGERY Bilateral    TOTAL KNEE ARTHROPLASTY Right 06/16/2019   Procedure: TOTAL KNEE ARTHROPLASTY;  Surgeon: Christena Flake, MD;  Location: [facility];  Service: Orthopedics;  Laterality: Right;    Current Medications: Current Meds  Medication Sig   aspirin EC 81 MG tablet Take 81 mg by mouth daily. Swallow whole.   carvedilol (COREG) 25 MG tablet Take 1 tablet (25 mg total) by mouth 2 (two) times daily.   cholecalciferol (VITAMIN D3) 25 MCG (  1000 UNIT) tablet Take 1,000 Units by mouth at bedtime.   famotidine (PEPCID) 20 MG tablet One after supper   ibuprofen (ADVIL) 200 MG tablet Take 400 mg by mouth every 8 (eight) hours as needed (for pain.).   losartan (COZAAR) 50 MG tablet Take 1 tablet (50 mg total) by mouth daily. (Patient taking differently: Take 50 mg by mouth 2 (two) times daily.)   Magnesium 200 MG TABS Take by mouth daily at 2 am.   omeprazole (PRILOSEC) 20 MG capsule Take 20 mg by mouth at bedtime.   pantoprazole (PROTONIX) 40 MG tablet TAKE 1 TABLET (40 MG TOTAL) BY MOUTH DAILY. TAKE 30-60 MIN  BEFORE FIRST MEAL OF THE DAY   rosuvastatin (CRESTOR) 5 MG tablet Take 5 mg by mouth at bedtime.    [DISCONTINUED] metoprolol succinate (TOPROL-XL) 100 MG 24 hr tablet One half twice daily (Patient taking differently: Take 100 mg by mouth every morning. One half twice daily)    Allergies:   Other and Sulfa antibiotics   Social History   Socioeconomic History   Marital status: Married    Spouse name: Not on file   Number of children: Not on file   Years of education: Not on file   Highest education level: Not on file  Occupational History   Not on file  Tobacco Use   Smoking status: Never   Smokeless tobacco: Never  Vaping Use   Vaping Use: Never used  Substance and Sexual Activity   Alcohol use: Never   Drug use: Never   Sexual activity: Not on file  Other Topics Concern   Not on file  Social History Narrative   Not on file   Social Determinants of Health   Financial Resource Strain: Not on file  Food Insecurity: Not on file  Transportation Needs: Not on file  Physical Activity: Not on file  Stress: Not on file  Social Connections: Not on file     Family History:  The patient's family history is not on file.  ROS:   12-point review of systems is negative unless otherwise noted in the HPI.   EKGs/Labs/Other Studies Reviewed:    Studies reviewed were summarized above. The additional studies were reviewed today:  2D echo 03/28/2019: 1. Left ventricular ejection fraction, by estimation, is 60 to 65%. The  left ventricle has normal function. The left ventricle has no regional  wall motion abnormalities. There is mild left ventricular hypertrophy.  Left ventricular diastolic parameters  are consistent with Grade I diastolic dysfunction (impaired relaxation).   2. Right ventricular systolic function is normal. The right ventricular  size is normal. Tricuspid regurgitation signal is inadequate for assessing  PA pressure.   3. The mitral valve is normal in  structure. No evidence of mitral valve  regurgitation. No evidence of mitral stenosis.   4. The aortic valve is normal in structure. Aortic valve regurgitation is  not visualized. No aortic stenosis is present.   5. The inferior vena cava is normal in size with greater than 50%  respiratory variability, suggesting right atrial pressure of 3 mmHg. __________  Coronary CTA 03/31/2019: Aorta: Normal size. Minimal aortic root calcifications. No dissection.   Aortic Valve:  Trileaflet.  No calcifications.   Coronary Arteries:  Normal coronary origin.  Right dominance.   RCA is a large dominant artery that gives rise to PDA and PLA. There is no plaque.   Left main is a large artery that gives rise to LAD and LCX  arteries.   LAD is a large vessel that has no plaque.   LCX is a non-dominant artery that gives rise to an OM1 branch. There is no plaque.   Other findings:   Normal pulmonary vein drainage into the left atrium.   Normal left atrial appendage without a thrombus.   Normal size of the pulmonary artery.   IMPRESSION: 1. Coronary calcium score of 13.7. Minimal coronary calcification. this was 73rd percentile for age and sex matched control. 2. Normal coronary origin with right dominance. 3. Minimal coronary calcification in the ostial LM 4. CAD-RADS 1. Minimal non-obstructive CAD (0-24%). Consider non-atherosclerotic causes of chest pain. Consider preventive therapy and risk factor modification.    EKG:  EKG is ordered today.  The EKG ordered today demonstrates NSR, 62 bpm, poor R wave progression along the precordial leads, no acute ST-T changes, no significant change when compared to prior tracing  Recent Labs: 01/29/2021: Hemoglobin 14.8; Platelets 193  Recent Lipid Panel No results found for: "CHOL", "TRIG", "HDL", "CHOLHDL", "VLDL", "LDLCALC", "LDLDIRECT"  PHYSICAL EXAM:    VS:  BP 134/82   Pulse 62   Ht 5\' 6"  (1.676 m)   Wt 229 lb 9.6 oz (104.1 kg)   SpO2  96%   BMI 37.06 kg/m   BMI: Body mass index is 37.06 kg/m.  Physical Exam Vitals reviewed.  Constitutional:      Appearance: She is well-developed.  HENT:     Head: Normocephalic and atraumatic.  Eyes:     General:        Right eye: No discharge.        Left eye: No discharge.  Neck:     Vascular: No JVD.  Cardiovascular:     Rate and Rhythm: Normal rate and regular rhythm.     Pulses:          Dorsalis pedis pulses are 2+ on the right side and 2+ on the left side.       Posterior tibial pulses are 2+ on the right side and 2+ on the left side.     Heart sounds: Normal heart sounds, S1 normal and S2 normal. Heart sounds not distant. No midsystolic click and no opening snap. No murmur heard.    No friction rub.  Pulmonary:     Effort: Pulmonary effort is normal. No respiratory distress.     Breath sounds: Normal breath sounds. No decreased breath sounds, wheezing or rales.  Chest:     Chest wall: No tenderness.  Abdominal:     General: There is no distension.  Musculoskeletal:     Cervical back: Normal range of motion.     Comments: Trivial bilateral pedal edema.  Skin:    General: Skin is warm and dry.     Nails: There is no clubbing.  Neurological:     Mental Status: She is alert and oriented to person, place, and time.  Psychiatric:        Speech: Speech normal.        Behavior: Behavior normal.        Thought Content: Thought content normal.        Judgment: Judgment normal.     Wt Readings from Last 3 Encounters:  09/19/21 229 lb 9.6 oz (104.1 kg)  04/22/21 230 lb 9.6 oz (104.6 kg)  03/07/21 234 lb (106.1 kg)     ASSESSMENT & PLAN:   Nonobstructive CAD: No symptoms suggestive of angina or decompensation.  Coronary CTA in 03/2019 showed  minimal nonobstructive CAD involving the ostial LAD.  Aggressive risk factor modification and primary prevention including aspirin, beta-blocker, and statin.  No indication for further ischemic testing at this time.  HTN:  Blood pressure 138/90 at triage with a repeat blood pressure of 134/82.  Stop metoprolol succinate.  Start carvedilol 25 mg twice daily.  Continue losartan 50 mg twice daily.  She will also discontinue NSAIDs.  Intolerant to CPAP with sleep apnea likely contributing to her hypertension as well.  Upon review of her sleep study, and with new FDA guidelines regarding patient BMI, she would likely qualify for an Inspire device.  We will refer her to Dr. Mayford Knife to weigh in along with possible referral to ENT thereafter.  Low-sodium diet is encouraged.  HLD: LDL 146 in 10/2018.  Goal LDL less than 70.  She remains on rosuvastatin 5 mg.  Followed by PCP.  Obesity with OSA: Weight loss is encouraged through heartily diet and regular exercise.  Intolerant to CPAP.  It appears that she would qualify for an Inspire device based on an AHI of 15.8, heart rate 56 bpm, and a BMI of 37 with updated FDA guidelines allowing for treatment with a BMI up to 40.  Untreated sleep apnea likely contributing to hypertension.  Headache disorder: Possibly exacerbated by elevated BP, though cannot exclude rebound headache given frequent NSAID use.  Discussed washout of NSAIDs.  Hydrate.  Recent MRI of the brain without acute intracranial pathology.  She has an appointment with neurology coming up.   Disposition: F/u with Dr. Azucena Cecil or an APP in 1.   Medication Adjustments/Labs and Tests Ordered: Current medicines are reviewed at length with the patient today.  Concerns regarding medicines are outlined above. Medication changes, Labs and Tests ordered today are summarized above and listed in the Patient Instructions accessible in Encounters.   Signed, Eula Listen, PA-C 09/19/2021 10:14 AM     [facility] [address] [place], [place] [address] [phone]

## 2021-09-24 IMAGING — CT CT HEART MORP W/ CTA COR W/ SCORE W/ CA W/CM &/OR W/O CM
2 of 13 series · 5 of 20 positions shown, 6 images · non-contrast
Comparison: None.

Addendum:
CLINICAL DATA: Atypical angina.  Hx of htn, hld

EXAM:
Cardiac/Coronary  CTA
TECHNIQUE: The patient was scanned on a Siemens Somatoform go.Top scanner.

[Series 23: multiphase % cta coronary 0.60 · axial · 0.38mm/px · z∈[-1445,-1380]mm · 3 of 3280 slices shown, 4 images]
[im 820/3280  vessel]
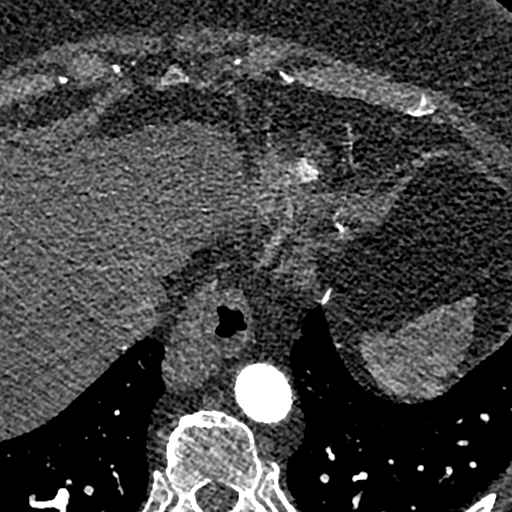
[im 820/3280  lung]
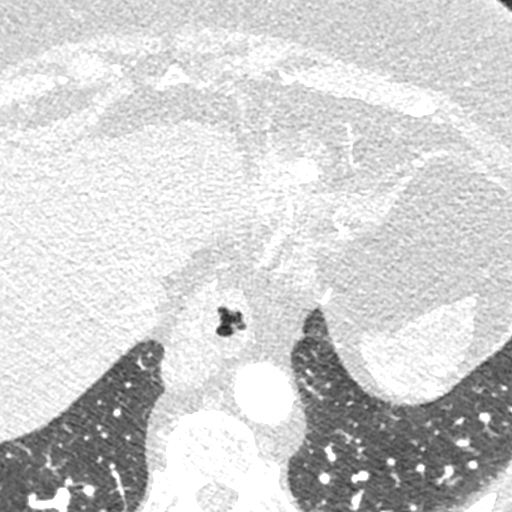
[im 1640/3280  vessel]
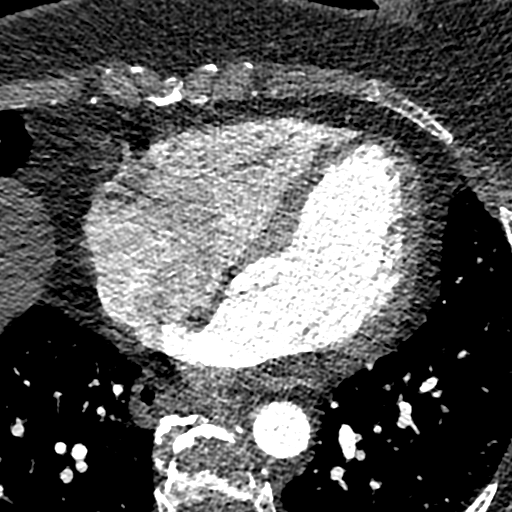
[im 2460/3280  vessel]
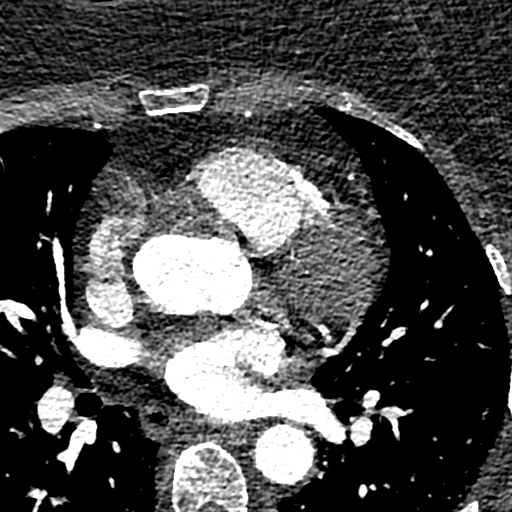

[Series 40: ms multiphase cta coronary 0.60 · axial · 0.38mm/px · z∈[-1434,-1391]mm · 2 of 2624 slices shown]
[im 875/2624  vessel]
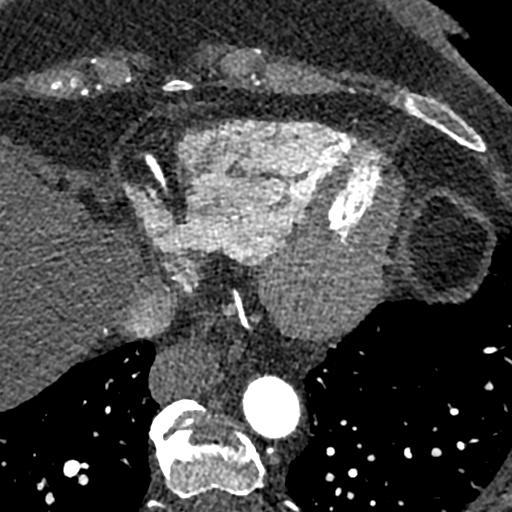
[im 1749/2624  vessel]
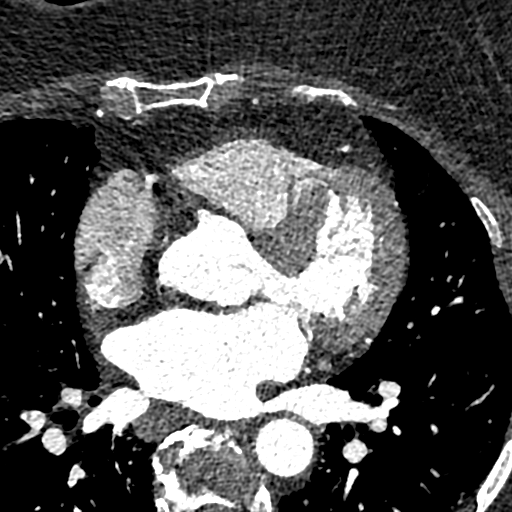

[5 of 20 positions shown; findings below may reference images not displayed]

FINDINGS: A retrospective scan was triggered in the descending thoracic aorta.
Axial non-contrast 3 mm slices were carried out through the heart.
The data set was analyzed on a dedicated work station and scored
using the Agatson method. Gantry rotation speed was 330 msecs and
collimation was .6 mm. 100mg of metoprolol po, 5mg iv metoprolol,
and 0.8 mg of sl NTG was given. The 3D data set was reconstructed in
5% intervals of the 55-95 % of the R-R cycle. Diastolic phases were
analyzed on a dedicated work station using MPR, MIP and VRT modes.
The patient received 125 cc of contrast.

Aorta: Normal size. Minimal aortic root calcifications. No
dissection.

Aortic Valve:  Trileaflet.  No calcifications.

Coronary Arteries:  Normal coronary origin.  Right dominance.

RCA is a large dominant artery that gives rise to PDA and PLA. There
is no plaque.

Left main is a large artery that gives rise to LAD and LCX arteries.

LAD is a large vessel that has no plaque.

LCX is a non-dominant artery that gives rise to an OM1 branch. There
is no plaque.

Other findings:

Normal pulmonary vein drainage into the left atrium.

Normal left atrial appendage without a thrombus.

Normal size of the pulmonary artery.
IMPRESSION: 1. Coronary calcium score of 13.7. Minimal coronary calcification.
this was 73rd percentile for age and sex matched control.

2. Normal coronary origin with right dominance.

3. Minimal coronary calcification in the ostial LM

4. CAD-RADS 1. Minimal non-obstructive CAD (0-24%). Consider
non-atherosclerotic causes of chest pain. Consider preventive
therapy and risk factor modification.

EXAM:
OVER-READ INTERPRETATION  CT CHEST

The following report is an over-read performed by radiologist Dr.
over-read does not include interpretation of cardiac or coronary
anatomy or pathology. The coronary CTA interpretation by the
cardiologist is attached.
FINDINGS: Limited view of the lung parenchyma demonstrates no suspicious
nodularity. Airways are normal.

Limited view of the mediastinum demonstrates no adenopathy.
Esophagus normal.

Limited view of the upper abdomen unremarkable.

Limited view of the skeleton and chest wall is unremarkable.
IMPRESSION: No significant extracardiac findings.

*** End of Addendum ***
FINDINGS: A retrospective scan was triggered in the descending thoracic aorta.
Axial non-contrast 3 mm slices were carried out through the heart.
The data set was analyzed on a dedicated work station and scored
using the Agatson method. Gantry rotation speed was 330 msecs and
collimation was .6 mm. 100mg of metoprolol po, 5mg iv metoprolol,
and 0.8 mg of sl NTG was given. The 3D data set was reconstructed in
5% intervals of the 55-95 % of the R-R cycle. Diastolic phases were
analyzed on a dedicated work station using MPR, MIP and VRT modes.
The patient received 125 cc of contrast.

Aorta: Normal size. Minimal aortic root calcifications. No
dissection.

Aortic Valve:  Trileaflet.  No calcifications.

Coronary Arteries:  Normal coronary origin.  Right dominance.

RCA is a large dominant artery that gives rise to PDA and PLA. There
is no plaque.

Left main is a large artery that gives rise to LAD and LCX arteries.

LAD is a large vessel that has no plaque.

LCX is a non-dominant artery that gives rise to an OM1 branch. There
is no plaque.

Other findings:

Normal pulmonary vein drainage into the left atrium.

Normal left atrial appendage without a thrombus.

Normal size of the pulmonary artery.
IMPRESSION: 1. Coronary calcium score of 13.7. Minimal coronary calcification.
this was 73rd percentile for age and sex matched control.

2. Normal coronary origin with right dominance.

3. Minimal coronary calcification in the ostial LM

4. CAD-RADS 1. Minimal non-obstructive CAD (0-24%). Consider
non-atherosclerotic causes of chest pain. Consider preventive
therapy and risk factor modification.

## 2021-10-29 NOTE — Progress Notes (Signed)
Cardiology Office Note    Date:  11/01/2021   ID:  Claire Miller, DOB 01-01-1961, MRN [id number]  PCP:  Lorelei Pont, DO  Cardiologist:  Debbe Odea, MD  Electrophysiologist:  None   Chief Complaint: Follow up  History of Present Illness:   Claire Miller is a 61 y.o. female with history of minimal nonobstructive coronary artery calcification by coronary CTA in 03/2019, HTN, HLD, obesity, OSA intolerant to CPAP, GERD, and osteoarthritis who presents for follow-up of HTN.   She was evaluated for chest pain in 02/2019.  Echo at that time demonstrated an EF of 60 to 65%, no regional wall motion abnormalities, mild LVH, grade 1 diastolic dysfunction, normal RV systolic function and ventricular cavity size, no significant valvular abnormalities, and an estimated right atrial pressure of 3 mmHg.  Coronary CTA in 03/2019 showed no significant extracardiac findings with a calcium score of 13.7 which was the 73rd percentile.  There was minimal coronary artery calcification in the ostial left main.  She was seen in the office in 08/2020 continuing to note occasional chest discomfort associated with exertion as well as daytime fatigue, sleepiness, and exertional dyspnea.  She has subsequently been diagnosed with sleep apnea.  PFTs in 01/2021 were unrevealing.  She was last seen in the office in 09/2021 and was without symptoms of angina or decompensation.  She noted elevations in her BP ranging from 130s to 170s systolic.  She was transitioned from Toprol-XL to carvedilol and advised to discontinue NSAIDs.  It was also noted her underlying sleep apnea, with intolerance to CPAP, was likely contributing to her elevated BP.  She was referred to sleep medicine.  She can then doing well from a cardiac perspective, and is without symptoms of angina or decompensation.  She has tolerated the transition from metoprolol to carvedilol.  She is without symptoms of dyspnea, palpitations, dizziness, presyncope, or  syncope.  No lower extremity swelling.  She does continue to have some headaches, though these are somewhat improved.  She is followed by outside office for her headaches.  She is scheduled to see Dr. Mayford Knife next month for further discussion regarding treatment options for her sleep apnea.   Labs independently reviewed: 09/2021 - Hgb 15.4, PLT 213, potassium 3.9, BUN 10, serum creatinine 0.7, albumin 4.8, AST 50, ALT 53, magnesium 1.9 08/2020 - TSH normal 10/2018 - TC 225, TG 146, HDL 50, LDL 146  Past Medical History:  Diagnosis Date   Chest pain    a. 03/2019 Cor CTA: Nl cors. cor Ca2+ = 13.7 (73rd %'ile). No significant non-cardiac findings.   Complication of anesthesia 2009   woke up during foot surgery    Diastolic dysfunction    a. 03/2019 Echo: EF 60-65%, no rwma, Gr1 DD, nl RV fxn.   Gastric polyps    Gastritis    Headache    Hiatal hernia    History of kidney stones    Hyperlipidemia    Hypertension    Kidney stones     Past Surgical History:  Procedure Laterality Date   BACK SURGERY  01/09/1988   CARPAL TUNNEL RELEASE Left    CHOLECYSTECTOMY  2012   ESOPHAGOGASTRODUODENOSCOPY N/A 04/11/2020   Procedure: ESOPHAGOGASTRODUODENOSCOPY (EGD);  Surgeon: Toledo, Boykin Nearing, MD;  Location: [facility] ENDOSCOPY;  Service: Gastroenterology;  Laterality: N/A;   FOOT SURGERY Bilateral    TOTAL KNEE ARTHROPLASTY Right 06/16/2019   Procedure: TOTAL KNEE ARTHROPLASTY;  Surgeon: Christena Flake, MD;  Location: [facility] ORS;  Service:  Orthopedics;  Laterality: Right;    Current Medications: Current Meds  Medication Sig   aspirin EC 81 MG tablet Take 81 mg by mouth daily. Swallow whole.   carvedilol (COREG) 25 MG tablet Take 1 tablet (25 mg total) by mouth 2 (two) times daily.   cholecalciferol (VITAMIN D3) 25 MCG (1000 UNIT) tablet Take 1,000 Units by mouth at bedtime.   Coenzyme Q10 (CO Q 10 PO) Take by mouth daily at 2 am.   famotidine (PEPCID) 20 MG tablet One after supper   ibuprofen (ADVIL)  200 MG tablet Take 400 mg by mouth every 8 (eight) hours as needed (for pain.).   losartan (COZAAR) 50 MG tablet Take 1 tablet (50 mg total) by mouth daily. (Patient taking differently: Take 50 mg by mouth 2 (two) times daily.)   Magnesium 200 MG TABS Take by mouth daily at 2 am.   Multiple Vitamin (MULTIVITAMIN) tablet Take 1 tablet by mouth daily.   pantoprazole (PROTONIX) 40 MG tablet TAKE 1 TABLET (40 MG TOTAL) BY MOUTH DAILY. TAKE 30-60 MIN BEFORE FIRST MEAL OF THE DAY   Riboflavin 25 MG TABS Take 50 mg by mouth daily at 2 am.   rosuvastatin (CRESTOR) 5 MG tablet Take 5 mg by mouth at bedtime.    topiramate (TOPAMAX) 50 MG tablet Take 100 mg by mouth daily.   TURMERIC PO Take by mouth daily in the afternoon.    Allergies:   Other and Sulfa antibiotics   Social History   Socioeconomic History   Marital status: Married    Spouse name: Not on file   Number of children: Not on file   Years of education: Not on file   Highest education level: Not on file  Occupational History   Not on file  Tobacco Use   Smoking status: Never   Smokeless tobacco: Never  Vaping Use   Vaping Use: Never used  Substance and Sexual Activity   Alcohol use: Never   Drug use: Never   Sexual activity: Not on file  Other Topics Concern   Not on file  Social History Narrative   Not on file   Social Determinants of Health   Financial Resource Strain: Not on file  Food Insecurity: Not on file  Transportation Needs: Not on file  Physical Activity: Not on file  Stress: Not on file  Social Connections: Not on file     Family History:  The patient's family history is not on file.  ROS:   12-point review of systems is negative unless otherwise noted in HPI.   EKGs/Labs/Other Studies Reviewed:    Studies reviewed were summarized above. The additional studies were reviewed today:  2D echo 03/28/2019: 1. Left ventricular ejection fraction, by estimation, is 60 to 65%. The  left ventricle has  normal function. The left ventricle has no regional  wall motion abnormalities. There is mild left ventricular hypertrophy.  Left ventricular diastolic parameters  are consistent with Grade I diastolic dysfunction (impaired relaxation).   2. Right ventricular systolic function is normal. The right ventricular  size is normal. Tricuspid regurgitation signal is inadequate for assessing  PA pressure.   3. The mitral valve is normal in structure. No evidence of mitral valve  regurgitation. No evidence of mitral stenosis.   4. The aortic valve is normal in structure. Aortic valve regurgitation is  not visualized. No aortic stenosis is present.   5. The inferior vena cava is normal in size with greater than 50%  respiratory variability, suggesting right atrial pressure of 3 mmHg. __________   Coronary CTA 03/31/2019: Aorta: Normal size. Minimal aortic root calcifications. No dissection.   Aortic Valve:  Trileaflet.  No calcifications.   Coronary Arteries:  Normal coronary origin.  Right dominance.   RCA is a large dominant artery that gives rise to PDA and PLA. There is no plaque.   Left main is a large artery that gives rise to LAD and LCX arteries.   LAD is a large vessel that has no plaque.   LCX is a non-dominant artery that gives rise to an OM1 branch. There is no plaque.   Other findings:   Normal pulmonary vein drainage into the left atrium.   Normal left atrial appendage without a thrombus.   Normal size of the pulmonary artery.   IMPRESSION: 1. Coronary calcium score of 13.7. Minimal coronary calcification. this was 73rd percentile for age and sex matched control. 2. Normal coronary origin with right dominance. 3. Minimal coronary calcification in the ostial LM 4. CAD-RADS 1. Minimal non-obstructive CAD (0-24%). Consider non-atherosclerotic causes of chest pain. Consider preventive therapy and risk factor modification.   EKG:  EKG is not ordered today.     PHYSICAL EXAM:    VS:  BP 100/70 (BP Location: Left Arm, Patient Position: Sitting, Cuff Size: Large)   Pulse 78   Ht 5\' 5"  (1.651 m)   Wt 231 lb 8 oz (105 kg)   SpO2 96%   BMI 38.52 kg/m   BMI: Body mass index is 38.52 kg/m.  Physical Exam Vitals reviewed.  Constitutional:      Appearance: She is well-developed.  HENT:     Head: Normocephalic and atraumatic.  Eyes:     General:        Right eye: No discharge.        Left eye: No discharge.  Neck:     Vascular: No JVD.  Cardiovascular:     Rate and Rhythm: Normal rate and regular rhythm.     Pulses:          Posterior tibial pulses are 2+ on the right side and 2+ on the left side.     Heart sounds: Normal heart sounds, S1 normal and S2 normal. Heart sounds not distant. No midsystolic click and no opening snap. No murmur heard.    No friction rub.  Pulmonary:     Effort: Pulmonary effort is normal. No respiratory distress.     Breath sounds: Normal breath sounds. No decreased breath sounds, wheezing or rales.  Chest:     Chest wall: No tenderness.  Abdominal:     General: There is no distension.  Musculoskeletal:     Cervical back: Normal range of motion.     Right lower leg: No edema.     Left lower leg: No edema.  Skin:    General: Skin is warm and dry.     Nails: There is no clubbing.  Neurological:     Mental Status: She is alert and oriented to person, place, and time.  Psychiatric:        Speech: Speech normal.        Behavior: Behavior normal.        Thought Content: Thought content normal.        Judgment: Judgment normal.     Wt Readings from Last 3 Encounters:  11/01/21 231 lb 8 oz (105 kg)  09/19/21 229 lb 9.6 oz (104.1 kg)  04/22/21 230 lb  9.6 oz (104.6 kg)     ASSESSMENT & PLAN:   Nonobstructive CAD: No symptoms concerning for angina or decompensation.  Coronary CTA in 03/2019 showed minimal nonobstructive CAD involving the ostial left main.  Aggressive risk factor modification and primary  prevention is recommended including aspirin, carvedilol, losartan, and rosuvastatin.  No indication for further ischemic testing at this time.  HTN: Blood pressure is well controlled in the office today.  She is without symptoms of dizziness, presyncope, or syncope.  Continue current doses of carvedilol and losartan.  HLD: LDL 146 in 10/2018.  She remains on rosuvastatin 5 mg.  Followed by PCP.  Obesity with OSA: Weight loss is encouraged throughout healthy diet and regular exercise.  She is intolerant to CPAP.  She has been referred to Dr. Mayford Knife for further management of his sleep apnea.  She may qualify for an inspire device with an AHI of 15.8, heart rate 56 bpm, and BMI of 37 with updated FDA guidelines.  Untreated sleep apnea is likely contributing to hypertension.    Disposition: F/u with Dr. Azucena Cecil or an APP in 3 months.   Medication Adjustments/Labs and Tests Ordered: Current medicines are reviewed at length with the patient today.  Concerns regarding medicines are outlined above. Medication changes, Labs and Tests ordered today are summarized above and listed in the Patient Instructions accessible in Encounters.   Signed, Eula Listen, PA-C 11/01/2021 9:15 AM     [facility] [place] [address] [place], [place] [address] [phone]

## 2021-11-01 ENCOUNTER — Ambulatory Visit: Payer: BC Managed Care – PPO | Attending: Physician Assistant | Admitting: Physician Assistant

## 2021-11-01 ENCOUNTER — Encounter: Payer: Self-pay | Admitting: Physician Assistant

## 2021-11-01 VITALS — BP 100/70 | HR 78 | Ht 65.0 in | Wt 231.5 lb

## 2021-11-01 DIAGNOSIS — G4733 Obstructive sleep apnea (adult) (pediatric): Secondary | ICD-10-CM

## 2021-11-01 DIAGNOSIS — E785 Hyperlipidemia, unspecified: Secondary | ICD-10-CM

## 2021-11-01 DIAGNOSIS — Z6837 Body mass index (BMI) 37.0-37.9, adult: Secondary | ICD-10-CM

## 2021-11-01 DIAGNOSIS — I2510 Atherosclerotic heart disease of native coronary artery without angina pectoris: Secondary | ICD-10-CM | POA: Diagnosis not present

## 2021-11-01 DIAGNOSIS — I10 Essential (primary) hypertension: Secondary | ICD-10-CM | POA: Diagnosis not present

## 2021-11-01 NOTE — Patient Instructions (Signed)
Medication Instructions:  No changes at this time.   *If you need a refill on your cardiac medications before your next appointment, please call your pharmacy*   Lab Work: None  If you have labs (blood work) drawn today and your tests are completely normal, you will receive your results only by: MyChart Message (if you have MyChart) OR A paper copy in the mail If you have any lab test that is abnormal or we need to change your treatment, we will call you to review the results.   Testing/Procedures: None   Follow-Up: At [facility], you and your health needs are our priority.  As part of our continuing mission to provide you with exceptional heart care, we have created designated Provider Care Teams.  These Care Teams include your primary Cardiologist (physician) and Advanced Practice Providers (APPs -  Physician Assistants and Nurse Practitioners) who all work together to provide you with the care you need, when you need it.   Your next appointment:   3 month(s)  The format for your next appointment:   In Person  Provider:   Brian Agbor-Etang, MD or Ryan Dunn, PA-C         Important Information About Sugar       

## 2021-12-04 ENCOUNTER — Encounter: Payer: Self-pay | Admitting: Cardiology

## 2021-12-04 ENCOUNTER — Ambulatory Visit: Payer: BC Managed Care – PPO | Attending: Cardiology | Admitting: Cardiology

## 2021-12-04 VITALS — BP 108/70 | HR 74 | Ht 66.0 in | Wt 229.6 lb

## 2021-12-04 DIAGNOSIS — I10 Essential (primary) hypertension: Secondary | ICD-10-CM | POA: Diagnosis not present

## 2021-12-04 DIAGNOSIS — G4733 Obstructive sleep apnea (adult) (pediatric): Secondary | ICD-10-CM | POA: Diagnosis not present

## 2021-12-04 NOTE — Addendum Note (Signed)
Addended by: Luellen Pucker on: 12/04/2021 08:38 AM   Modules accepted: Orders

## 2021-12-04 NOTE — Progress Notes (Signed)
Sleep Medicine CONSULT Note    Date:  12/04/2021   ID:  Claire Miller, DOB Feb 10, 1960, MRN [id number]  PCP:  Lorelei Pont, DO  Cardiologist:  Eula Listen, PA   Chief Complaint  Patient presents with   New Patient (Initial Visit)    Obstructive sleep apnea    History of Present Illness:  Claire Miller is a 61 y.o. female who is being seen today for the evaluation of obstructive sleep apnea at the request of Eula Listen, [place].  This is a 60 year old female with history of cord artery calcifications, diastolic dysfunction, hypertension hyperlipidemia .  She had had some problems with SOB as well as excessive daytime sleepiness .  She would easily falls asleep watching TV.  She has severe snoring as well. She had a home sleep study in January 2023 showing AHI of 15.8/h and O2 saturations as low as 79%.  She was started on auto CPAP from 5 to 15 cm H2O but unfortunately has not been tolerant to it.  She was not compliant with her PAP device and had to turn it back in for noncompliance.  She could falls asleep with it but if she woke up she would get very claustraphobic and have to take it off.  She tried multiple masks without success.  She is now referred for sleep evaluation to see if she is a candidate for the inspire device.  Past Medical History:  Diagnosis Date   Chest pain    a. 03/2019 Cor CTA: Nl cors. cor Ca2+ = 13.7 (73rd %'ile). No significant non-cardiac findings.   Complication of anesthesia 2009   woke up during foot surgery    Diastolic dysfunction    a. 03/2019 Echo: EF 60-65%, no rwma, Gr1 DD, nl RV fxn.   Gastric polyps    Gastritis    Headache    Hiatal hernia    History of kidney stones    Hyperlipidemia    Hypertension    Kidney stones     Past Surgical History:  Procedure Laterality Date   BACK SURGERY  01/09/1988   CARPAL TUNNEL RELEASE Left    CHOLECYSTECTOMY  2012   ESOPHAGOGASTRODUODENOSCOPY N/A 04/11/2020   Procedure: ESOPHAGOGASTRODUODENOSCOPY (EGD);   Surgeon: Toledo, Boykin Nearing, MD;  Location: [facility] ENDOSCOPY;  Service: Gastroenterology;  Laterality: N/A;   FOOT SURGERY Bilateral    TOTAL KNEE ARTHROPLASTY Right 06/16/2019   Procedure: TOTAL KNEE ARTHROPLASTY;  Surgeon: Christena Flake, MD;  Location: [facility] ORS;  Service: Orthopedics;  Laterality: Right;    Current Medications: Current Meds  Medication Sig   aspirin EC 81 MG tablet Take 81 mg by mouth daily. Swallow whole.   carvedilol (COREG) 25 MG tablet Take 1 tablet (25 mg total) by mouth 2 (two) times daily.   cholecalciferol (VITAMIN D3) 25 MCG (1000 UNIT) tablet Take 1,000 Units by mouth at bedtime.   Coenzyme Q10 (CO Q 10 PO) Take by mouth daily at 2 am.   famotidine (PEPCID) 20 MG tablet One after supper   ibuprofen (ADVIL) 200 MG tablet Take 400 mg by mouth every 8 (eight) hours as needed (for pain.).   losartan (COZAAR) 50 MG tablet Take 1 tablet (50 mg total) by mouth daily. (Patient taking differently: Take 50 mg by mouth 2 (two) times daily.)   Magnesium 200 MG TABS Take by mouth daily at 2 am.   Multiple Vitamin (MULTIVITAMIN) tablet Take 1 tablet by mouth daily.   pantoprazole (PROTONIX) 40 MG  tablet TAKE 1 TABLET (40 MG TOTAL) BY MOUTH DAILY. TAKE 30-60 MIN BEFORE FIRST MEAL OF THE DAY   Riboflavin 25 MG TABS Take 50 mg by mouth daily at 2 am.   rosuvastatin (CRESTOR) 5 MG tablet Take 5 mg by mouth at bedtime.    TURMERIC PO Take by mouth daily in the afternoon.    Allergies:   Other and Sulfa antibiotics   Social History   Socioeconomic History   Marital status: Married    Spouse name: Not on file   Number of children: Not on file   Years of education: Not on file   Highest education level: Not on file  Occupational History   Not on file  Tobacco Use   Smoking status: Never   Smokeless tobacco: Never  Vaping Use   Vaping Use: Never used  Substance and Sexual Activity   Alcohol use: Never   Drug use: Never   Sexual activity: Not on file  Other Topics  Concern   Not on file  Social History Narrative   Not on file   Social Determinants of Health   Financial Resource Strain: Not on file  Food Insecurity: Not on file  Transportation Needs: Not on file  Physical Activity: Not on file  Stress: Not on file  Social Connections: Not on file     Family History:  The patient's family history is not on file.   ROS:   Please see the history of present illness.    ROS All other systems reviewed and are negative.     02/18/2019   10:42 AM  PAD Screen  Previous PAD dx? No  Previous surgical procedure? No  Pain with walking? No  Subsides with rest? No  Feet/toe relief with dangling? No  Painful, non-healing ulcers? No  Extremities discolored? No       PHYSICAL EXAM:   VS:  BP 108/70   Pulse 74   Ht 5\' 6"  (1.676 m)   Wt 229 lb 9.6 oz (104.1 kg)   SpO2 97%   BMI 37.06 kg/m    GEN: Well nourished, well developed, in no acute distress  HEENT: normal  Neck: no JVD, carotid bruits, or masses Cardiac: RRR; no murmurs, rubs, or gallops,no edema.  Intact distal pulses bilaterally.  Respiratory:  clear to auscultation bilaterally, normal work of breathing GI: soft, nontender, nondistended, + BS MS: no deformity or atrophy  Skin: warm and dry, no rash Neuro:  Alert and Oriented x 3, Strength and sensation are intact Psych: euthymic mood, full affect  Wt Readings from Last 3 Encounters:  12/04/21 229 lb 9.6 oz (104.1 kg)  11/01/21 231 lb 8 oz (105 kg)  09/19/21 229 lb 9.6 oz (104.1 kg)      Studies/Labs Reviewed:   Home sleep study  Recent Labs: 01/29/2021: Hemoglobin 14.8; Platelets 193     Additional studies/ records that were reviewed today include:  None    ASSESSMENT:    1. Obstructive sleep apnea   2. Essential hypertension      PLAN:  In order of problems listed above:  OSA  -The patient had a home sleep study almost a year ago showing AHI 15.9/h but has been intolerant to CPAP -She is interested in  the inspire device but I suspect that her weight is going to be a limiting factor as her BMI is 37.  Her AHI does qualify her for the inspire. -I will go ahead and refer her to Dr.  Jenne Pane for initial evaluation but she likely is going to have to lose weight before she will be a candidate  2.  Hypertension -BP controlled on exam today -Continue prescription drug managed with carvedilol 25 mg twice daily, losartan 50 mg daily with as needed refills  Time Spent: 20 minutes total time of encounter, including 15 minutes spent in face-to-face patient care on the date of this encounter. This time includes coordination of care and counseling regarding above mentioned problem list. Remainder of non-face-to-face time involved reviewing chart documents/testing relevant to the patient encounter and documentation in the medical record. I have independently reviewed documentation from referring provider  Medication Adjustments/Labs and Tests Ordered: Current medicines are reviewed at length with the patient today.  Concerns regarding medicines are outlined above.  Medication changes, Labs and Tests ordered today are listed in the Patient Instructions below.  There are no Patient Instructions on file for this visit.   Signed, Armanda Magic, MD  12/04/2021 8:21 AM    [facility] [address] [place], [place], [place]  [address] Phone: [phone]; Fax: [phone]

## 2021-12-04 NOTE — Patient Instructions (Signed)
Medication Instructions:  Your physician recommends that you continue on your current medications as directed. Please refer to the Current Medication list given to you today.  *If you need a refill on your cardiac medications before your next appointment, please call your pharmacy*   Lab Work: None. If you have labs (blood work) drawn today and your tests are completely normal, you will receive your results only by: MyChart Message (if you have MyChart) OR A paper copy in the mail If you have any lab test that is abnormal or we need to change your treatment, we will call you to review the results.   Testing/Procedures: None.   Follow-Up: At [facility], you and your health needs are our priority.  As part of our continuing mission to provide you with exceptional heart care, we have created designated Provider Care Teams.  These Care Teams include your primary Cardiologist (physician) and Advanced Practice Providers (APPs -  Physician Assistants and Nurse Practitioners) who all work together to provide you with the care you need, when you need it.  We recommend signing up for the patient portal called "MyChart".  Sign up information is provided on this After Visit Summary.  MyChart is used to connect with patients for Virtual Visits (Telemedicine).  Patients are able to view lab/test results, encounter notes, upcoming appointments, etc.  Non-urgent messages can be sent to your provider as well.   To learn more about what you can do with MyChart, go to [email or web address].    Your next appointment:   As needed.    Provider:   Dr. Carolanne Grumbling  Other Instructions You have been referred to Dr. Christia Reading, ENT. He will evaluate you for an Inspire device.    Important Information About Sugar

## 2022-01-31 ENCOUNTER — Ambulatory Visit: Payer: BC Managed Care – PPO | Admitting: Physician Assistant

## 2022-02-08 NOTE — Progress Notes (Signed)
Cardiology Office Note    Date:  02/12/2022   ID:  Claire Miller, DOB 10-13-1960, MRN [id number]  PCP:  Lorelei Pont, DO  Cardiologist:  Debbe Odea, MD  Electrophysiologist:  None   Chief Complaint: Follow up  History of Present Illness:   Claire Miller is a 62 y.o. female with history of minimal nonobstructive coronary artery calcification by coronary CTA in 03/2019, HTN, HLD, obesity, OSA intolerant to CPAP, GERD, and osteoarthritis who presents for follow-up of HTN.   She was evaluated for chest pain in 02/2019.  Echo at that time demonstrated an EF of 60 to 65%, no regional wall motion abnormalities, mild LVH, grade 1 diastolic dysfunction, normal RV systolic function and ventricular cavity size, no significant valvular abnormalities, and an estimated right atrial pressure of 3 mmHg.  Coronary CTA in 03/2019 showed no significant extracardiac findings with a calcium score of 13.7 which was the 73rd percentile.  There was minimal coronary artery calcification in the ostial left main.  She was seen in the office in 08/2020 continuing to note occasional chest discomfort associated with exertion as well as daytime fatigue, sleepiness, and exertional dyspnea.  She has subsequently been diagnosed with sleep apnea.  PFTs in 01/2021 were unrevealing.  She was seen in the office in 09/2021 and noted elevations in her BP ranging from 130s to 170s systolic.  She was transitioned from Toprol-XL to carvedilol and advised to discontinue NSAIDs.  It was also noted her underlying sleep apnea, with intolerance to CPAP, was likely contributing to her elevated BP.  She was referred to sleep medicine.  She was last seen in the office in 10/2021 and remained without symptoms of angina or decompensation.  She was tolerating transition from metoprolol to carvedilol.  Blood pressure was well-controlled.  She was referred to sleep medicine for further discussion regarding treatment of sleep apnea.  She was  subsequently referred to ENT for initial evaluation of potential inspire device with recommendation for patient to lose weight before she would likely become a candidate.  She comes in doing well from a cardiac perspective and is without symptoms of angina or decompensation.  No dyspnea, palpitations, dizziness, presyncope, or syncope.  Headaches have improved some.  She is scheduled to see ENT in 04/2022 for further evaluation of sleep apnea and potential discussion of Inspire device.  Tolerating carvedilol, rosuvastatin, and aspirin without issues.   Labs independently reviewed: 09/2021 - Hgb 15.4, PLT 213, potassium 3.9, BUN 10, serum creatinine 0.7, albumin 4.8, AST 50, ALT 53, magnesium 1.9 08/2020 - TSH normal 10/2018 - TC 225, TG 146, HDL 50, LDL 146  Past Medical History:  Diagnosis Date   Chest pain    a. 03/2019 Cor CTA: Nl cors. cor Ca2+ = 13.7 (73rd %'ile). No significant non-cardiac findings.   Complication of anesthesia 2009   woke up during foot surgery    Diastolic dysfunction    a. 03/2019 Echo: EF 60-65%, no rwma, Gr1 DD, nl RV fxn.   Gastric polyps    Gastritis    Headache    Hiatal hernia    History of kidney stones    Hyperlipidemia    Hypertension    Kidney stones     Past Surgical History:  Procedure Laterality Date   BACK SURGERY  01/09/1988   CARPAL TUNNEL RELEASE Left    CHOLECYSTECTOMY  2012   ESOPHAGOGASTRODUODENOSCOPY N/A 04/11/2020   Procedure: ESOPHAGOGASTRODUODENOSCOPY (EGD);  Surgeon: Toledo, Boykin Nearing, MD;  Location: [facility] ENDOSCOPY;  Service: Gastroenterology;  Laterality: N/A;   FOOT SURGERY Bilateral    TOTAL KNEE ARTHROPLASTY Right 06/16/2019   Procedure: TOTAL KNEE ARTHROPLASTY;  Surgeon: Christena Flake, MD;  Location: [facility] ORS;  Service: Orthopedics;  Laterality: Right;    Current Medications: Current Meds  Medication Sig   aspirin EC 81 MG tablet Take 81 mg by mouth daily. Swallow whole.   carvedilol (COREG) 25 MG tablet Take 1 tablet (25 mg  total) by mouth 2 (two) times daily.   cholecalciferol (VITAMIN D3) 25 MCG (1000 UNIT) tablet Take 1,000 Units by mouth at bedtime.   Coenzyme Q10 (CO Q 10 PO) Take by mouth daily at 2 am.   famotidine (PEPCID) 20 MG tablet One after supper   ibuprofen (ADVIL) 200 MG tablet Take 400 mg by mouth every 8 (eight) hours as needed (for pain.).   losartan (COZAAR) 50 MG tablet Take 1 tablet (50 mg total) by mouth daily. (Patient taking differently: Take 50 mg by mouth 2 (two) times daily.)   Magnesium 200 MG TABS Take by mouth daily at 2 am.   Multiple Vitamin (MULTIVITAMIN) tablet Take 1 tablet by mouth daily.   pantoprazole (PROTONIX) 40 MG tablet TAKE 1 TABLET (40 MG TOTAL) BY MOUTH DAILY. TAKE 30-60 MIN BEFORE FIRST MEAL OF THE DAY   Riboflavin 25 MG TABS Take 50 mg by mouth daily at 2 am.   rosuvastatin (CRESTOR) 5 MG tablet Take 5 mg by mouth at bedtime.    TURMERIC PO Take by mouth daily in the afternoon.    Allergies:   Other and Sulfa antibiotics   Social History   Socioeconomic History   Marital status: Married    Spouse name: Not on file   Number of children: Not on file   Years of education: Not on file   Highest education level: Not on file  Occupational History   Not on file  Tobacco Use   Smoking status: Never   Smokeless tobacco: Never  Vaping Use   Vaping Use: Never used  Substance and Sexual Activity   Alcohol use: Never   Drug use: Never   Sexual activity: Not on file  Other Topics Concern   Not on file  Social History Narrative   Not on file   Social Determinants of Health   Financial Resource Strain: Not on file  Food Insecurity: Not on file  Transportation Needs: Not on file  Physical Activity: Not on file  Stress: Not on file  Social Connections: Not on file     Family History:  The patient's family history is not on file.  ROS:   12-point review of systems is negative unless otherwise noted in the HPI.   EKGs/Labs/Other Studies Reviewed:     Studies reviewed were summarized above. The additional studies were reviewed today:  2D echo 03/28/2019: 1. Left ventricular ejection fraction, by estimation, is 60 to 65%. The  left ventricle has normal function. The left ventricle has no regional  wall motion abnormalities. There is mild left ventricular hypertrophy.  Left ventricular diastolic parameters  are consistent with Grade I diastolic dysfunction (impaired relaxation).   2. Right ventricular systolic function is normal. The right ventricular  size is normal. Tricuspid regurgitation signal is inadequate for assessing  PA pressure.   3. The mitral valve is normal in structure. No evidence of mitral valve  regurgitation. No evidence of mitral stenosis.   4. The aortic valve is normal in structure. Aortic valve regurgitation is  not visualized. No aortic stenosis is present.   5. The inferior vena cava is normal in size with greater than 50%  respiratory variability, suggesting right atrial pressure of 3 mmHg. __________   Coronary CTA 03/31/2019: Aorta: Normal size. Minimal aortic root calcifications. No dissection.   Aortic Valve:  Trileaflet.  No calcifications.   Coronary Arteries:  Normal coronary origin.  Right dominance.   RCA is a large dominant artery that gives rise to PDA and PLA. There is no plaque.   Left main is a large artery that gives rise to LAD and LCX arteries.   LAD is a large vessel that has no plaque.   LCX is a non-dominant artery that gives rise to an OM1 branch. There is no plaque.   Other findings:   Normal pulmonary vein drainage into the left atrium.   Normal left atrial appendage without a thrombus.   Normal size of the pulmonary artery.   IMPRESSION: 1. Coronary calcium score of 13.7. Minimal coronary calcification. this was 73rd percentile for age and sex matched control. 2. Normal coronary origin with right dominance. 3. Minimal coronary calcification in the ostial LM 4.  CAD-RADS 1. Minimal non-obstructive CAD (0-24%). Consider non-atherosclerotic causes of chest pain. Consider preventive therapy and risk factor modification.   EKG:  EKG is not ordered today.   Recent Labs: No results found for requested labs within last 365 days.  Recent Lipid Panel No results found for: "CHOL", "TRIG", "HDL", "CHOLHDL", "VLDL", "LDLCALC", "LDLDIRECT"  PHYSICAL EXAM:    VS:  BP 102/76 (BP Location: Left Arm, Patient Position: Sitting, Cuff Size: Large)   Pulse 75   Ht 5\' 6"  (1.676 m)   Wt 231 lb 9.6 oz (105.1 kg)   SpO2 95%   BMI 37.38 kg/m   BMI: Body mass index is 37.38 kg/m.  Physical Exam Vitals reviewed.  Constitutional:      Appearance: She is well-developed.  HENT:     Head: Normocephalic and atraumatic.  Eyes:     General:        Right eye: No discharge.        Left eye: No discharge.  Neck:     Vascular: No JVD.  Cardiovascular:     Rate and Rhythm: Normal rate and regular rhythm.     Heart sounds: Normal heart sounds, S1 normal and S2 normal. Heart sounds not distant. No midsystolic click and no opening snap. No murmur heard.    No friction rub.  Pulmonary:     Effort: Pulmonary effort is normal. No respiratory distress.     Breath sounds: Normal breath sounds. No decreased breath sounds, wheezing or rales.  Chest:     Chest wall: No tenderness.  Abdominal:     General: There is no distension.  Musculoskeletal:     Cervical back: Normal range of motion.  Skin:    General: Skin is warm and dry.     Nails: There is no clubbing.  Neurological:     Mental Status: She is alert and oriented to person, place, and time.  Psychiatric:        Speech: Speech normal.        Behavior: Behavior normal.        Thought Content: Thought content normal.        Judgment: Judgment normal.     Wt Readings from Last 3 Encounters:  02/12/22 231 lb 9.6 oz (105.1 kg)  12/04/21 229 lb 9.6 oz (104.1 kg)  11/01/21 231 lb 8 oz (105 kg)     ASSESSMENT  & PLAN:   Nonobstructive CAD: No symptoms concerning for angina or decompensation.  Coronary CTA in 03/2019 showed minimal nonobstructive CAD as outlined above.  Continue aggressive risk factor modification and primary prevention including aspirin, carvedilol, losartan, and rosuvastatin.  No indication for further ischemic testing at this time.  HTN: Blood pressure is well-controlled in the office today.  She remains on carvedilol and losartan.  Check renal function and electrolytes.  HLD: LDL 146 in 10/2018.  Historically followed by PCP.  Check CMP, lipid panel, and direct LDL.  Obesity with OSA: Weight loss is encouraged throughout healthy diet and regular exercise.  She is intolerant to CPAP and will be seeing ENT in 04/2022 for consideration of Inspire device, though she may need to lose weight before qualifying.  She will begin a walking program.  When we see her in follow-up, could consider GLP-1 agonist to further reduce MACE risk.   Disposition: F/u with Dr. Azucena Cecil or an APP in 6 months.   Medication Adjustments/Labs and Tests Ordered: Current medicines are reviewed at length with the patient today.  Concerns regarding medicines are outlined above. Medication changes, Labs and Tests ordered today are summarized above and listed in the Patient Instructions accessible in Encounters.   Signed, Eula Listen, PA-C 02/12/2022 9:46 AM     [facility] [place] [address] [place], [place] [address] [phone]

## 2022-02-12 ENCOUNTER — Ambulatory Visit: Payer: BC Managed Care – PPO | Attending: Physician Assistant | Admitting: Physician Assistant

## 2022-02-12 ENCOUNTER — Other Ambulatory Visit
Admission: RE | Admit: 2022-02-12 | Discharge: 2022-02-12 | Disposition: A | Discharge status: Home / Self Care | Payer: BC Managed Care – PPO | Attending: Physician Assistant | Admitting: Physician Assistant

## 2022-02-12 ENCOUNTER — Encounter: Payer: Self-pay | Admitting: Physician Assistant

## 2022-02-12 VITALS — BP 102/76 | HR 75 | Ht 66.0 in | Wt 231.6 lb

## 2022-02-12 DIAGNOSIS — Z6837 Body mass index (BMI) 37.0-37.9, adult: Secondary | ICD-10-CM | POA: Diagnosis not present

## 2022-02-12 DIAGNOSIS — E7800 Pure hypercholesterolemia, unspecified: Secondary | ICD-10-CM | POA: Diagnosis present

## 2022-02-12 DIAGNOSIS — E785 Hyperlipidemia, unspecified: Secondary | ICD-10-CM

## 2022-02-12 DIAGNOSIS — I2510 Atherosclerotic heart disease of native coronary artery without angina pectoris: Secondary | ICD-10-CM

## 2022-02-12 DIAGNOSIS — G4733 Obstructive sleep apnea (adult) (pediatric): Secondary | ICD-10-CM

## 2022-02-12 DIAGNOSIS — I10 Essential (primary) hypertension: Secondary | ICD-10-CM | POA: Diagnosis not present

## 2022-02-12 LAB — COMPREHENSIVE METABOLIC PANEL
ALT: 31 U/L (ref 0–44)
AST: 31 U/L (ref 15–41)
Albumin: 4.1 g/dL (ref 3.5–5.0)
Alkaline Phosphatase: 48 U/L (ref 38–126)
Anion gap: 8 (ref 5–15)
BUN: 15 mg/dL (ref 8–23)
CO2: 24 mmol/L (ref 22–32)
Calcium: 9.5 mg/dL (ref 8.9–10.3)
Chloride: 108 mmol/L (ref 98–111)
Creatinine, Ser: 0.9 mg/dL (ref 0.44–1.00)
GFR, Estimated: >60 mL/min (ref >60)
Glucose, Bld: 121 mg/dL — ABNORMAL HIGH (ref 70–99)
Potassium: 4.2 mmol/L (ref 3.5–5.1)
Sodium: 140 mmol/L (ref 135–145)
Total Bilirubin: 0.8 mg/dL (ref 0.3–1.2)
Total Protein: 7 g/dL (ref 6.5–8.1)

## 2022-02-12 LAB — LIPID PANEL
Cholesterol: 164 mg/dL (ref 0–200)
HDL: 52 mg/dL (ref >40)
LDL Cholesterol: 80 mg/dL (ref 0–99)
Total CHOL/HDL Ratio: 3.2 RATIO
Triglycerides: 158 mg/dL — ABNORMAL HIGH (ref <150)
VLDL: 32 mg/dL (ref 0–40)

## 2022-02-12 LAB — CBC
HCT: 43.6 % (ref 36.0–46.0)
Hemoglobin: 14.6 g/dL (ref 12.0–15.0)
MCH: 31.5 pg (ref 26.0–34.0)
MCHC: 33.5 g/dL (ref 30.0–36.0)
MCV: 94.2 fL (ref 80.0–100.0)
Platelets: 211 10*3/uL (ref 150–400)
RBC: 4.63 MIL/uL (ref 3.87–5.11)
RDW: 11.8 % (ref 11.5–15.5)
WBC: 4.6 10*3/uL (ref 4.0–10.5)
nRBC: 0 % (ref 0.0–0.2)

## 2022-02-12 LAB — LDL CHOLESTEROL, DIRECT: Direct LDL: 100 mg/dL — ABNORMAL HIGH (ref 0–99)

## 2022-02-12 NOTE — Patient Instructions (Signed)
Medication Instructions:  No changes at this time.   *If you need a refill on your cardiac medications before your next appointment, please call your pharmacy*   Lab Work: Lipid, Direct LDL, CMET, and CBC to be done today over at the [facility]. Stop at registration desk to check in.   If you have labs (blood work) drawn today and your tests are completely normal, you will receive your results only by: [place] (if you have MyChart) OR A paper copy in the mail If you have any lab test that is abnormal or we need to change your treatment, we will call you to review the results.   Testing/Procedures: None   Follow-Up: At [facility], you and your health needs are our priority.  As part of our continuing mission to provide you with exceptional heart care, we have created designated Provider Care Teams.  These Care Teams include your primary Cardiologist (physician) and Advanced Practice Providers (APPs -  Physician Assistants and Nurse Practitioners) who all work together to provide you with the care you need, when you need it.  Your next appointment:   6 month(s)  Provider:   Kate Sable, MD or Christell Faith, PA-C

## 2022-02-13 ENCOUNTER — Other Ambulatory Visit: Payer: Self-pay | Admitting: *Deleted

## 2022-02-13 DIAGNOSIS — I2510 Atherosclerotic heart disease of native coronary artery without angina pectoris: Secondary | ICD-10-CM

## 2022-02-13 DIAGNOSIS — E785 Hyperlipidemia, unspecified: Secondary | ICD-10-CM

## 2022-02-13 DIAGNOSIS — E7800 Pure hypercholesterolemia, unspecified: Secondary | ICD-10-CM

## 2022-02-13 MED ORDER — ROSUVASTATIN CALCIUM 20 MG PO TABS
20.0000 mg | ORAL_TABLET | Freq: Every day | ORAL | 3 refills | Status: DC
Start: 2022-02-13 — End: 2022-12-08

## 2022-03-15 ENCOUNTER — Other Ambulatory Visit: Payer: Self-pay | Admitting: Internal Medicine

## 2022-06-10 ENCOUNTER — Other Ambulatory Visit: Payer: Self-pay | Admitting: Physician Assistant

## 2022-07-07 ENCOUNTER — Other Ambulatory Visit: Payer: Self-pay | Admitting: Internal Medicine

## 2022-09-06 ENCOUNTER — Other Ambulatory Visit: Payer: Self-pay | Admitting: Physician Assistant

## 2022-09-15 NOTE — Progress Notes (Unsigned)
Cardiology Office Note    Date:  09/15/2022   ID:  Claire Miller, DOB [birth year], MRN [id number]  PCP:  Lorelei Pont, DO  Cardiologist:  Debbe Odea, MD  Electrophysiologist:  None   Chief Complaint: ***  History of Present Illness:   Claire Miller is a 62 y.o. female with history of ***  High-sensitivity troponin negative proBNP 86  ***   Labs independently reviewed: 08/2022 - Hgb 14.4, PLT 200, potassium 4.4, BUN 9, serum creatinine 0.83, albumin 3.9, AST/ALT normal, magnesium 2.1 02/2022 - TC 164, TG 158, HDL 52, LDL 80 08/2020 - TSH normal  Past Medical History:  Diagnosis Date   Chest pain    a. 03/2019 Cor CTA: Nl cors. cor Ca2+ = 13.7 (73rd %'ile). No significant non-cardiac findings.   Complication of anesthesia 2009   woke up during foot surgery    Diastolic dysfunction    a. 03/2019 Echo: EF 60-65%, no rwma, Gr1 DD, nl RV fxn.   Gastric polyps    Gastritis    Headache    Hiatal hernia    History of kidney stones    Hyperlipidemia    Hypertension    Kidney stones     Past Surgical History:  Procedure Laterality Date   BACK SURGERY  01/09/1988   CARPAL TUNNEL RELEASE Left    CHOLECYSTECTOMY  2012   ESOPHAGOGASTRODUODENOSCOPY N/A 04/11/2020   Procedure: ESOPHAGOGASTRODUODENOSCOPY (EGD);  Surgeon: Toledo, Boykin Nearing, MD;  Location: [facility] ENDOSCOPY;  Service: Gastroenterology;  Laterality: N/A;   FOOT SURGERY Bilateral    TOTAL KNEE ARTHROPLASTY Right 06/16/2019   Procedure: TOTAL KNEE ARTHROPLASTY;  Surgeon: Christena Flake, MD;  Location: [facility] ORS;  Service: Orthopedics;  Laterality: Right;    Current Medications: No outpatient medications have been marked as taking for the 09/16/22 encounter (Appointment) with Sondra Barges, PA-C.    Allergies:   Other and Sulfa antibiotics   Social History   Socioeconomic History   Marital status: Married    Spouse name: Not on file   Number of children: Not on file   Years of education: Not on file   Highest  education level: Not on file  Occupational History   Not on file  Tobacco Use   Smoking status: Never   Smokeless tobacco: Never  Vaping Use   Vaping status: Never Used  Substance and Sexual Activity   Alcohol use: Never   Drug use: Never   Sexual activity: Not on file  Other Topics Concern   Not on file  Social History Narrative   Not on file   Social Determinants of Health   Financial Resource Strain: Low Risk  (01/24/2022)   Received from [facility], [facility]   Overall Financial Resource Strain (CARDIA)    Difficulty of Paying Living Expenses: Not hard at all  Food Insecurity: No Food Insecurity (01/24/2022)   Received from [facility], [facility]   Hunger Vital Sign    Worried About Running Out of Food in the Last Year: Never true    Ran Out of Food in the Last Year: Never true  Transportation Needs: No Transportation Needs (01/24/2022)   Received from [employer], [facility]   PRAPARE - Transportation    Lack of Transportation (Medical): No    Lack of Transportation (Non-Medical): No  Physical Activity: Unknown (01/24/2022)   Received from [facility], [facility]   Exercise Vital Sign    Days of Exercise per Week: 0 days  Minutes of Exercise per Session: Not on file  Stress: No Stress Concern Present (01/24/2022)   Received from [facility], [facility] of Occupational Health - Occupational Stress Questionnaire    Feeling of Stress : Only a little  Social Connections: Moderately Integrated (01/24/2022)   Received from [facility], [facility]   Social Network    How would you rate your social network (family, work, friends)?: Adequate participation with social networks     Family History:  The patient's family history is not on file.  ROS:   12-point review of systems is negative unless otherwise noted in the HPI.   EKGs/Labs/Other Studies Reviewed:    Studies reviewed were summarized above. The additional  studies were reviewed today:  2D echo 03/28/2019: 1. Left ventricular ejection fraction, by estimation, is 60 to 65%. The  left ventricle has normal function. The left ventricle has no regional  wall motion abnormalities. There is mild left ventricular hypertrophy.  Left ventricular diastolic parameters  are consistent with Grade I diastolic dysfunction (impaired relaxation).   2. Right ventricular systolic function is normal. The right ventricular  size is normal. Tricuspid regurgitation signal is inadequate for assessing  PA pressure.   3. The mitral valve is normal in structure. No evidence of mitral valve  regurgitation. No evidence of mitral stenosis.   4. The aortic valve is normal in structure. Aortic valve regurgitation is  not visualized. No aortic stenosis is present.   5. The inferior vena cava is normal in size with greater than 50%  respiratory variability, suggesting right atrial pressure of 3 mmHg. __________   Coronary CTA 03/31/2019: Aorta: Normal size. Minimal aortic root calcifications. No dissection.   Aortic Valve:  Trileaflet.  No calcifications.   Coronary Arteries:  Normal coronary origin.  Right dominance.   RCA is a large dominant artery that gives rise to PDA and PLA. There is no plaque.   Left main is a large artery that gives rise to LAD and LCX arteries.   LAD is a large vessel that has no plaque.   LCX is a non-dominant artery that gives rise to an OM1 branch. There is no plaque.   Other findings:   Normal pulmonary vein drainage into the left atrium.   Normal left atrial appendage without a thrombus.   Normal size of the pulmonary artery.   IMPRESSION: 1. Coronary calcium score of 13.7. Minimal coronary calcification. this was 73rd percentile for age and sex matched control. 2. Normal coronary origin with right dominance. 3. Minimal coronary calcification in the ostial LM 4. CAD-RADS 1. Minimal non-obstructive CAD (0-24%).  Consider non-atherosclerotic causes of chest pain. Consider preventive therapy and risk factor modification.   EKG:  EKG is ordered today.  The EKG ordered today demonstrates ***  Recent Labs: 02/12/2022: ALT 31; BUN 15; Creatinine, Ser 0.90; Hemoglobin 14.6; Platelets 211; Potassium 4.2; Sodium 140  Recent Lipid Panel    Component Value Date/Time   CHOL 164 02/12/2022 0946   TRIG 158 (H) 02/12/2022 0946   HDL 52 02/12/2022 0946   CHOLHDL 3.2 02/12/2022 0946   VLDL 32 02/12/2022 0946   LDLCALC 80 02/12/2022 0946   LDLDIRECT 100 (H) 02/12/2022 0946    PHYSICAL EXAM:    VS:  There were no vitals taken for this visit.  BMI: There is no height or weight on file to calculate BMI.  Physical Exam  Wt Readings from Last 3 Encounters:  02/12/22 231 lb  9.6 oz (105.1 kg)  12/04/21 229 lb 9.6 oz (104.1 kg)  11/01/21 231 lb 8 oz (105 kg)     ASSESSMENT & PLAN:   ***   {Are you ordering a CV Procedure (e.g. stress test, cath, DCCV, TEE, etc)?   Press F2        :[id number]}     Disposition: F/u with Dr. Azucena Cecil or an APP in ***.   Medication Adjustments/Labs and Tests Ordered: Current medicines are reviewed at length with the patient today.  Concerns regarding medicines are outlined above. Medication changes, Labs and Tests ordered today are summarized above and listed in the Patient Instructions accessible in Encounters.   Signed, Eula Listen, PA-C 09/15/2022 5:18 PM     [facility] [place] [address] [place], [place] [address] [phone]

## 2022-09-16 ENCOUNTER — Ambulatory Visit: Payer: BC Managed Care – PPO | Attending: Physician Assistant | Admitting: Physician Assistant

## 2022-09-16 ENCOUNTER — Encounter: Payer: Self-pay | Admitting: Physician Assistant

## 2022-09-16 VITALS — BP 126/60 | HR 67 | Ht 65.0 in | Wt 234.0 lb

## 2022-09-16 DIAGNOSIS — I2510 Atherosclerotic heart disease of native coronary artery without angina pectoris: Secondary | ICD-10-CM | POA: Diagnosis not present

## 2022-09-16 DIAGNOSIS — R072 Precordial pain: Secondary | ICD-10-CM

## 2022-09-16 DIAGNOSIS — E669 Obesity, unspecified: Secondary | ICD-10-CM

## 2022-09-16 DIAGNOSIS — I10 Essential (primary) hypertension: Secondary | ICD-10-CM | POA: Diagnosis not present

## 2022-09-16 DIAGNOSIS — G4733 Obstructive sleep apnea (adult) (pediatric): Secondary | ICD-10-CM

## 2022-09-16 DIAGNOSIS — Z6838 Body mass index (BMI) 38.0-38.9, adult: Secondary | ICD-10-CM

## 2022-09-16 DIAGNOSIS — E785 Hyperlipidemia, unspecified: Secondary | ICD-10-CM | POA: Diagnosis not present

## 2022-09-16 MED ORDER — METOPROLOL TARTRATE 100 MG PO TABS: ORAL_TABLET | ORAL | 0 refills | Status: DC

## 2022-09-16 NOTE — Patient Instructions (Addendum)
Medication Instructions:  Your Physician recommend you continue on your current medication as directed.    *If you need a refill on your cardiac medications before your next appointment, please call your pharmacy*   Lab Work: None If you have labs (blood work) drawn today and your tests are completely normal, you will receive your results only by: MyChart Message (if you have MyChart) OR A paper copy in the mail If you have any lab test that is abnormal or we need to change your treatment, we will call you to review the results.   Testing/Procedures:   Your cardiac CT will be scheduled at one of the below locations:   [facility] [address] [place], [place] [address] [phone]  OR   [facility] [address] [place], [place] [address] [phone]  If scheduled at [facility] or [facility], please arrive 15 mins early for check-in and test prep.  There is spacious parking and easy access to the radiology department from the [facility] entrance. Please enter here and check-in with the desk attendant.   Please follow these instructions carefully (unless otherwise directed):  An IV will be required for this test and Nitroglycerin will be given.   On the Night Before the Test: Be sure to Drink plenty of water. Do not consume any caffeinated/decaffeinated beverages or chocolate 12 hours prior to your test. Do not take any antihistamines 12 hours prior to your test.  On the Day of the Test: Drink plenty of water until 1 hour prior to the test. Do not eat any food 1 hour prior to test. You may take your regular medications prior to the test.  Take metoprolol (Lopressor) two hours prior to test. FEMALES- please wear underwire-free bra if available, avoid dresses & tight clothing   After the Test: Drink plenty of water. After receiving  IV contrast, you may experience a mild flushed feeling. This is normal. On occasion, you may experience a mild rash up to 24 hours after the test. This is not dangerous. If this occurs, you can take Benadryl 25 mg and increase your fluid intake. If you experience trouble breathing, this can be serious. If it is severe call 911 IMMEDIATELY. If it is mild, please call our office. If you take any of these medications: Glipizide/Metformin, Avandament, Glucavance, please do not take 48 hours after completing test unless otherwise instructed.  We will call to schedule your test 2-4 weeks out understanding that some insurance companies will need an authorization prior to the service being performed.   For more information and frequently asked questions, please visit our website : [email or web address]  For non-scheduling related questions, please contact the cardiac imaging nurse navigator should you have any questions/concerns: Cardiac Imaging Nurse Navigators Direct Office Dial: [phone]   For scheduling needs, including cancellations and rescheduling, please call [place], [phone].    Follow-Up: At [facility], you and your health needs are our priority.  As part of our continuing mission to provide you with exceptional heart care, we have created designated Provider Care Teams.  These Care Teams include your primary Cardiologist (physician) and Advanced Practice Providers (APPs -  Physician Assistants and Nurse Practitioners) who all work together to provide you with the care you need, when you need it.  We recommend signing up for the patient portal called "MyChart".  Sign up information is provided on this After Visit Summary.  MyChart is used to connect with patients for Virtual Visits (Telemedicine).  Patients are able to view lab/test results, encounter notes, upcoming appointments, etc.  Non-urgent messages can be sent to your provider as well.   To learn more  about what you can do with MyChart, go to [email or web address].    Your next appointment:   6 month(s)  Provider:   You may see Debbe Odea, MD or one of the following Advanced Practice Providers on your designated Care Team:   Eula Listen, PA-C  Other Instructions Your physician has requested that you have cardiac CT. Cardiac computed tomography (CT) is a painless test that uses an x-ray machine to take clear, detailed pictures of your heart. For further information please visit [email or web address] Please follow instruction sheet as given.

## 2022-09-17 LAB — BASIC METABOLIC PANEL
BUN/Creatinine Ratio: 16 (ref 12–28)
BUN: 13 mg/dL (ref 8–27)
CO2: 23 mmol/L (ref 20–29)
Calcium: 9.6 mg/dL (ref 8.7–10.3)
Chloride: 106 mmol/L (ref 96–106)
Creatinine, Ser: 0.79 mg/dL (ref 0.57–1.00)
Glucose: 111 mg/dL — ABNORMAL HIGH (ref 70–99)
Potassium: 4.9 mmol/L (ref 3.5–5.2)
Sodium: 143 mmol/L (ref 134–144)
eGFR: 83 mL/min/{1.73_m2} (ref >59)

## 2022-09-24 ENCOUNTER — Telehealth (HOSPITAL_COMMUNITY): Payer: Self-pay | Admitting: Emergency Medicine

## 2022-09-24 NOTE — Telephone Encounter (Signed)
Reaching out to patient to offer assistance regarding upcoming cardiac imaging study; pt verbalizes understanding of appt date/time, parking situation and where to check in, pre-test NPO status and medications ordered, and verified current allergies; name and call back number provided for further questions should they arise Claire Bond RN Navigator Cardiac Imaging [facility] [phone] office [phone] cell  100mg  metoprolol

## 2022-09-25 ENCOUNTER — Ambulatory Visit
Admission: RE | Admit: 2022-09-25 | Discharge: 2022-09-25 | Disposition: A | Discharge status: Home / Self Care | Payer: BC Managed Care – PPO | Source: Ambulatory Visit | Attending: Physician Assistant | Admitting: Physician Assistant

## 2022-09-25 DIAGNOSIS — R072 Precordial pain: Secondary | ICD-10-CM

## 2022-09-25 MED ORDER — SODIUM CHLORIDE 0.9 % IV SOLN: INTRAVENOUS | Status: DC

## 2022-09-25 MED ORDER — IOHEXOL 350 MG/ML SOLN
100.0000 mL | Freq: Once | INTRAVENOUS | Status: AC | PRN
  Administered 2022-09-25: 100 mL via INTRAVENOUS

## 2022-09-25 MED ORDER — NITROGLYCERIN 0.4 MG SL SUBL
0.8000 mg | SUBLINGUAL_TABLET | Freq: Once | SUBLINGUAL | Status: AC
  Administered 2022-09-25: 0.8 mg via SUBLINGUAL

## 2022-09-25 NOTE — Progress Notes (Signed)
Patient tolerated CT well. Drank water after. Vital signs stable encourage to drink water throughout day.Reasons explained and verbalized understanding. Ambulated steady gait.

## 2022-12-06 ENCOUNTER — Other Ambulatory Visit: Payer: Self-pay | Admitting: Physician Assistant

## 2023-03-29 IMAGING — CR DG CHEST 2V
1 series · 2 of 2 positions shown · non-contrast
Comparison: Chest x-ray 03/03/2020.

CLINICAL DATA: 60-year-old female with history of persistent cough.

EXAM:
CHEST - 2 VIEW

[Series 1: w chest pa · 0.14mm/px · 2 of 2 slices shown]
[im 1/2]
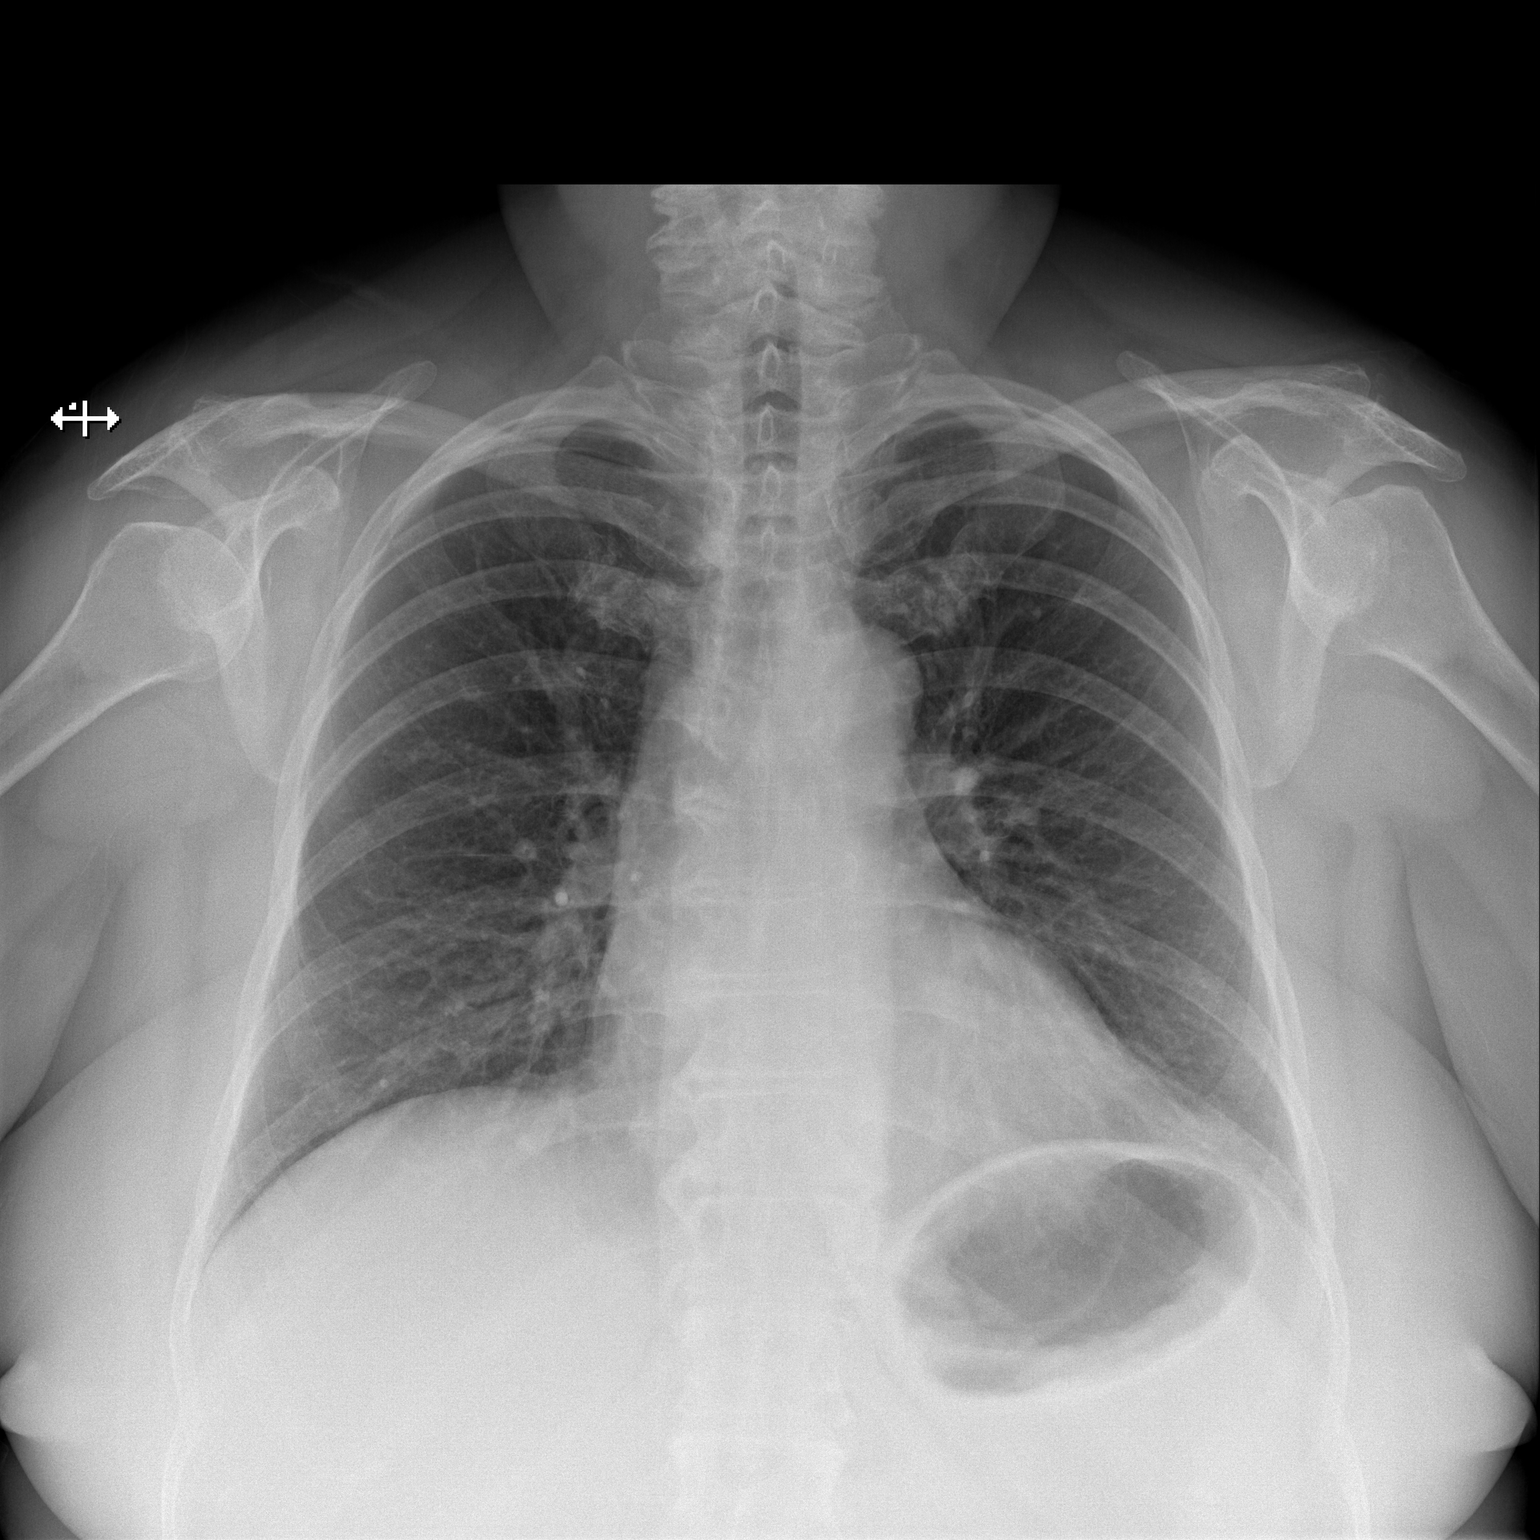
[im 2/2]
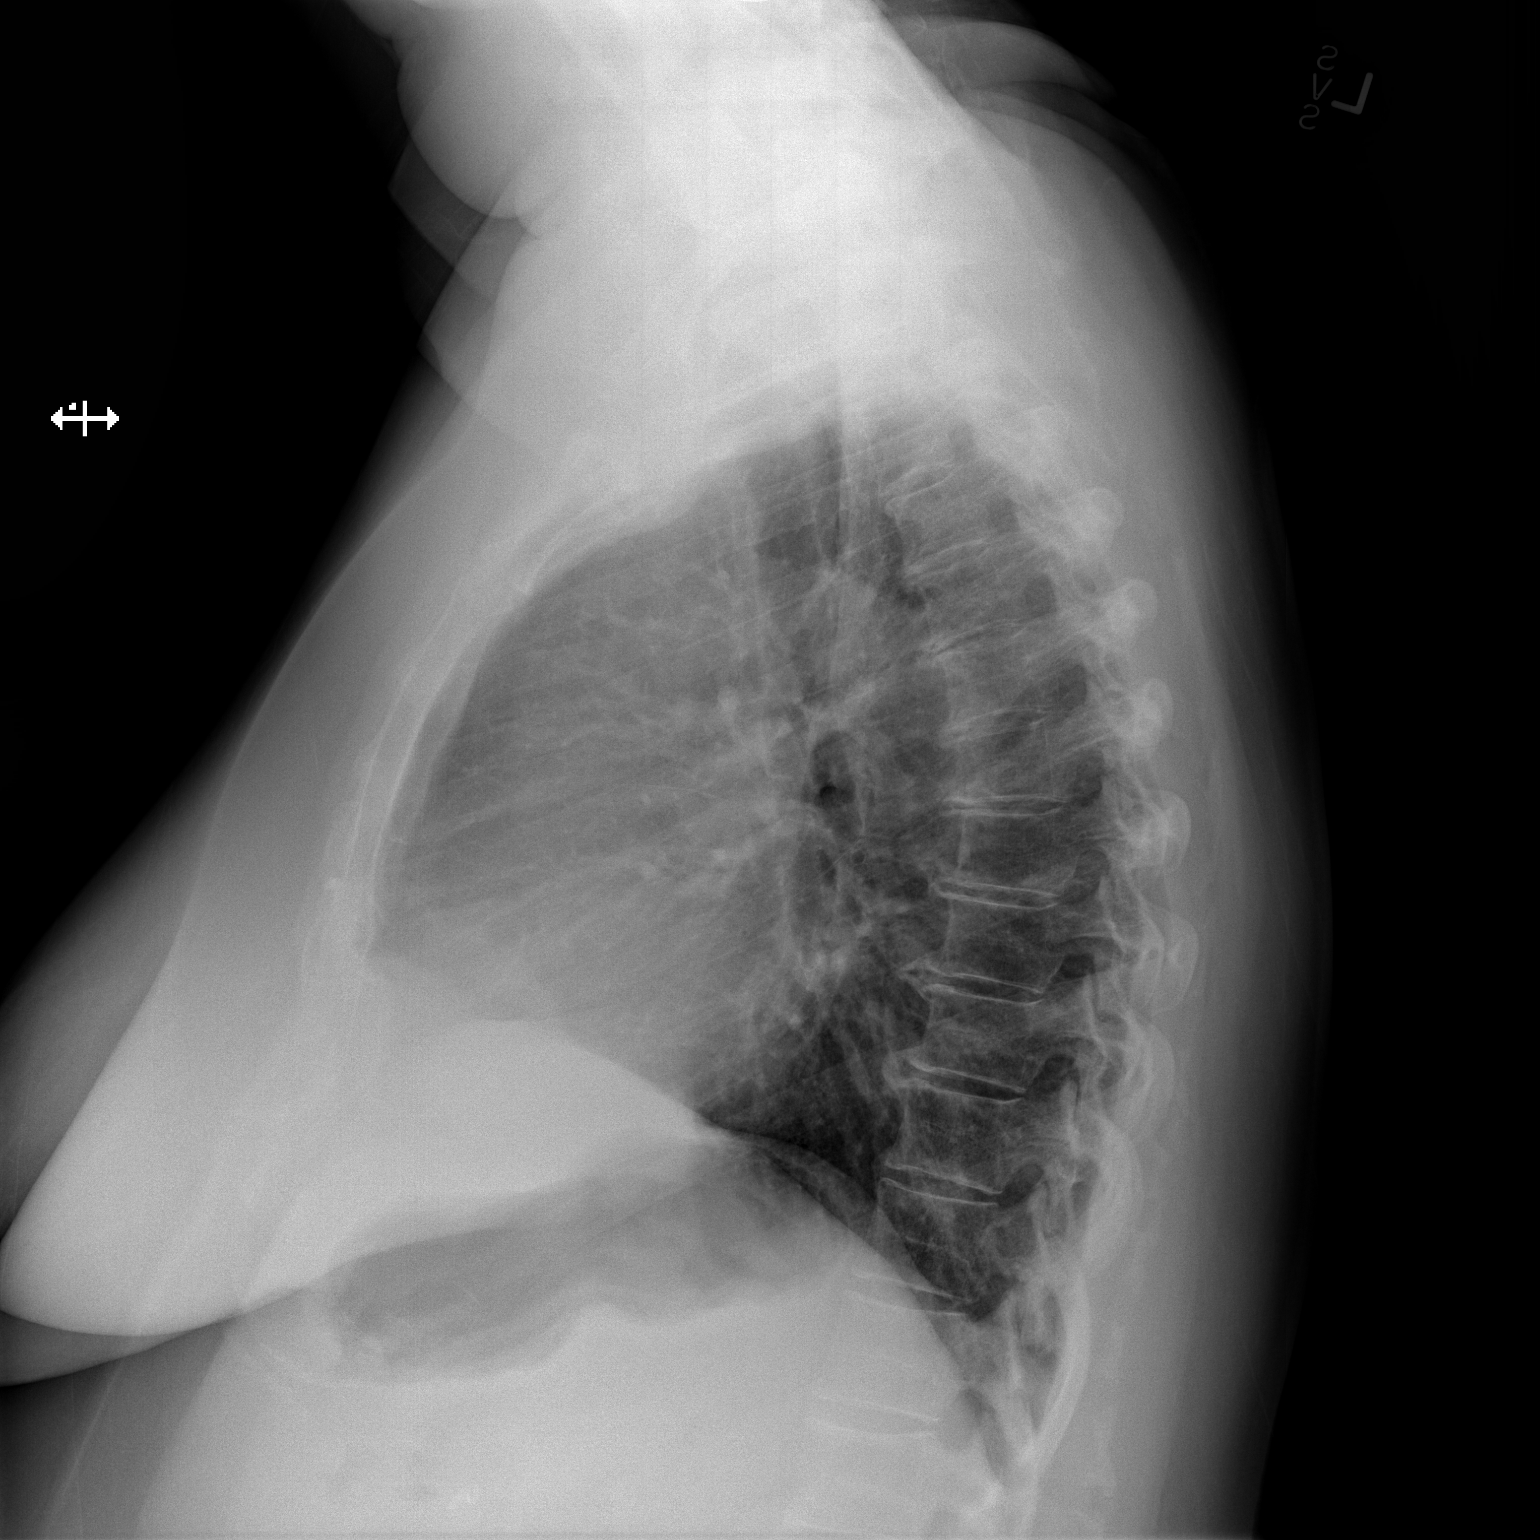

[2 of 2 positions shown; findings below may reference images not displayed]

FINDINGS: Lung volumes are normal. No consolidative airspace disease. No
pleural effusions. No pneumothorax. No pulmonary nodule or mass
noted. Pulmonary vasculature and the cardiomediastinal silhouette
are within normal limits.
IMPRESSION: No radiographic evidence of acute cardiopulmonary disease.

## 2023-05-04 NOTE — Progress Notes (Unsigned)
 Cardiology Office Note    Date:  05/05/2023   ID:  Claire Miller, DOB Jul 20, 1960, MRN [id number]  PCP:  Dorrine Gaudy, DO  Cardiologist:  Constancia Delton, MD  Electrophysiologist:  None   Chief Complaint: Follow-up  History of Present Illness:   Claire Miller is a 63 y.o. female with history of minimal nonobstructive coronary artery calcification by coronary CTA in 03/2019 and normal coronary arteries by coronary CTA in 09/2022, HTN, HLD, obesity, OSA intolerant to CPAP, GERD, and osteoarthritis who presents for follow-up of coronary CTA.  She was evaluated for chest pain in 02/2019.  Echo at that time demonstrated an EF of 60 to 65%, no regional wall motion abnormalities, mild LVH, grade 1 diastolic dysfunction, normal RV systolic function and ventricular cavity size, no significant valvular abnormalities, and an estimated right atrial pressure of 3 mmHg.  Coronary CTA in 03/2019 showed no significant extracardiac findings with a calcium  score of 13.7, which was the 73rd percentile.  There was minimal coronary artery calcification in the ostial left main.  She was seen in the office in 08/2020 continuing to note occasional chest discomfort associated with exertion as well as daytime fatigue, sleepiness, and exertional dyspnea.  She was subsequently diagnosed with sleep apnea.  PFTs in 01/2021 were unrevealing.  She was seen in the office in 09/2021 and noted elevations in her BP ranging from 130s to 170s systolic.  She was transitioned from Toprol -XL to carvedilol  and advised to discontinue NSAIDs.  It was also noted her underlying sleep apnea, with intolerance to CPAP, was likely contributing to her elevated BP.  She was referred to sleep medicine.  She was subsequently referred to ENT for initial evaluation of potential inspire device with recommendation for patient to lose weight before she would likely become a candidate.     She was seen at an outside ED on 08/21/2022 with a several week history  of intermittent chest pain that was at times postprandial, particularly after eating tomato, and other times occurred at rest with associated diaphoresis, lightheadedness, and nausea lasting several minutes with spontaneous resolution.  High-sensitivity troponin negative.  ProBNP 86.  Chest x-ray without acute cardiopulmonary process.  She was discharged with Carafate.  She was last seen in the office on 09/16/2022 and reported no further symptoms since initiating Carafate.  Subsequent coronary CTA in 09/2022 showed a calcium  score of 0 with no evidence of CAD.  She comes in today and is without symptoms of angina or cardiac decompensation.  She notes progressive lower extremity swelling over the past 2 weeks with the left lower extremity being slightly more swollen than the right along with some associated mild erythema on the anterior aspect of the left lower extremity.  She also notes some discomfort behind the left knee.  Recent car travel to the beach approximately 2 weeks ago.  She has been taking furosemide 20 mg daily for the past 2 weeks.  She does try and elevate her legs when sitting.  Does not currently wear compression socks.  Reports mother has significant lymphedema.  Her weight is up 6 pounds today when compared to her visit in 09/2022.  She does not monitor her sodium intake.  No progressive orthopnea, dizziness, presyncope, or syncope.   Labs independently reviewed: 09/2022 - BUN 13, serum creatinine 0.79, potassium 4.9 08/2022 - Hgb 14.4, PLT 200, albumin 3.9, AST/ALT normal, magnesium  2.1 02/2022 - TC 164, TG 158, HDL 52, LDL 80 08/2020 - TSH normal  Past Medical  History:  Diagnosis Date   Chest pain    a. 03/2019 Cor CTA: Nl cors. cor Ca2+ = 13.7 (73rd %'ile). No significant non-cardiac findings.   Complication of anesthesia 2009   woke up during foot surgery    Diastolic dysfunction    a. 03/2019 Echo: EF 60-65%, no rwma, Gr1 DD, nl RV fxn.   Gastric polyps    Gastritis     Headache    Hiatal hernia    History of kidney stones    Hyperlipidemia    Hypertension    Kidney stones     Past Surgical History:  Procedure Laterality Date   BACK SURGERY  01/09/1988   CARPAL TUNNEL RELEASE Left    CHOLECYSTECTOMY  2012   ESOPHAGOGASTRODUODENOSCOPY N/A 04/11/2020   Procedure: ESOPHAGOGASTRODUODENOSCOPY (EGD);  Surgeon: Toledo, Alphonsus Jeans, MD;  Location: [facility] ENDOSCOPY;  Service: Gastroenterology;  Laterality: N/A;   FOOT SURGERY Bilateral    TOTAL KNEE ARTHROPLASTY Right 06/16/2019   Procedure: TOTAL KNEE ARTHROPLASTY;  Surgeon: Elner Hahn, MD;  Location: [facility] ORS;  Service: Orthopedics;  Laterality: Right;    Current Medications: Current Meds  Medication Sig   aspirin  EC 81 MG tablet Take 81 mg by mouth daily. Swallow whole.   carvedilol  (COREG ) 25 MG tablet TAKE 1 TABLET BY MOUTH TWICE A DAY   cholecalciferol  (VITAMIN D3) 25 MCG (1000 UNIT) tablet Take 1,000 Units by mouth at bedtime.   Coenzyme Q10 (CO Q 10 PO) Take by mouth daily at 2 am.   famotidine  (PEPCID ) 20 MG tablet One after supper   furosemide (LASIX) 20 MG tablet Take 20 mg by mouth daily.   ibuprofen (ADVIL) 200 MG tablet Take 400 mg by mouth every 8 (eight) hours as needed (for pain.).   losartan  (COZAAR ) 50 MG tablet Take 1 tablet (50 mg total) by mouth daily.   Magnesium  200 MG TABS Take by mouth daily at 2 am.   Multiple Vitamin (MULTIVITAMIN) tablet Take 1 tablet by mouth daily.   pantoprazole  (PROTONIX ) 40 MG tablet TAKE 1 TABLET (40 MG TOTAL) BY MOUTH DAILY. TAKE 30-60 MIN BEFORE FIRST MEAL OF THE DAY   Riboflavin 25 MG TABS Take 50 mg by mouth daily at 2 am.   rosuvastatin  (CRESTOR ) 20 MG tablet TAKE 1 TABLET BY MOUTH EVERY DAY   sucralfate (CARAFATE) 1 g tablet Take 1 g by mouth 4 (four) times daily -  with meals and at bedtime.   TURMERIC PO Take by mouth daily in the afternoon.    Allergies:   Other and Sulfa antibiotics   Social History   Socioeconomic History   Marital  status: Married    Spouse name: Not on file   Number of children: Not on file   Years of education: Not on file   Highest education level: Not on file  Occupational History   Not on file  Tobacco Use   Smoking status: Never   Smokeless tobacco: Never  Vaping Use   Vaping status: Never Used  Substance and Sexual Activity   Alcohol use: Never   Drug use: Never   Sexual activity: Not on file  Other Topics Concern   Not on file  Social History Narrative   Not on file   Social Drivers of Health   Financial Resource Strain: Low Risk  (01/24/2022)   Received from [facility], [facility]   Overall Financial Resource Strain (CARDIA)    Difficulty of Paying Living Expenses: Not hard at all  Food Insecurity: No Food Insecurity (01/24/2022)   Received from [facility], [facility]   Hunger Vital Sign    Worried About Running Out of Food in the Last Year: Never true    Ran Out of Food in the Last Year: Never true  Transportation Needs: No Transportation Needs (01/24/2022)   Received from [facility], [facility]   [facility] - Transportation    Lack of Transportation (Medical): No    Lack of Transportation (Non-Medical): No  Physical Activity: Unknown (01/24/2022)   Received from [facility], [facility]   Exercise Vital Sign    Days of Exercise per Week: 0 days    Minutes of Exercise per Session: Not on file  Stress: No Stress Concern Present (01/24/2022)   Received from [facility], [facility] - Occupational Stress Questionnaire    Feeling of Stress : Only a little  Social Connections: Moderately Integrated (01/24/2022)   Received from [facility], [facility]   Social Network    How would you rate your social network (family, work, friends)?: Adequate participation with social networks     Family History:  The patient's family history is not on file.  ROS:   12-point review of systems is negative unless  otherwise noted in the HPI.   EKGs/Labs/Other Studies Reviewed:    Studies reviewed were summarized above. The additional studies were reviewed today:  2D echo 03/28/2019: 1. Left ventricular ejection fraction, by estimation, is 60 to 65%. The  left ventricle has normal function. The left ventricle has no regional  wall motion abnormalities. There is mild left ventricular hypertrophy.  Left ventricular diastolic parameters  are consistent with Grade I diastolic dysfunction (impaired relaxation).   2. Right ventricular systolic function is normal. The right ventricular  size is normal. Tricuspid regurgitation signal is inadequate for assessing  PA pressure.   3. The mitral valve is normal in structure. No evidence of mitral valve  regurgitation. No evidence of mitral stenosis.   4. The aortic valve is normal in structure. Aortic valve regurgitation is  not visualized. No aortic stenosis is present.   5. The inferior vena cava is normal in size with greater than 50%  respiratory variability, suggesting right atrial pressure of 3 mmHg. __________   Coronary CTA 03/31/2019: Aorta: Normal size. Minimal aortic root calcifications. No dissection.   Aortic Valve:  Trileaflet.  No calcifications.   Coronary Arteries:  Normal coronary origin.  Right dominance.   RCA is a large dominant artery that gives rise to PDA and PLA. There is no plaque.   Left main is a large artery that gives rise to LAD and LCX arteries.   LAD is a large vessel that has no plaque.   LCX is a non-dominant artery that gives rise to an OM1 branch. There is no plaque.   Other findings:   Normal pulmonary vein drainage into the left atrium.   Normal left atrial appendage without a thrombus.   Normal size of the pulmonary artery.   IMPRESSION: 1. Coronary calcium  score of 13.7. Minimal coronary calcification. this was 73rd percentile for age and sex matched control. 2. Normal coronary origin with right  dominance. 3. Minimal coronary calcification in the ostial LM 4. CAD-RADS 1. Minimal non-obstructive CAD (0-24%). Consider non-atherosclerotic causes of chest pain. Consider preventive therapy and risk factor modification. __________  Coronary CTA 09/25/2022: FINDINGS: Aorta:  Normal size.  No calcifications.  No dissection.   Aortic  Valve:  Trileaflet.  No calcifications.   Mitral annular calcifications.   Coronary Arteries:  Normal coronary origin.  Right dominance.   RCA is a dominant artery. There is no plaque.   Left main gives rise to LAD and LCX arteries. LM has no disease.   LAD has no plaque.   LCX is a non-dominant artery.  There is no plaque.   Other findings:   Normal pulmonary vein drainage into the left atrium.   Normal left atrial appendage without a thrombus.   Normal size of the pulmonary artery.   IMPRESSION: 1. Coronary calcium  score of 0. 2. Normal coronary origin with right dominance. 3. No evidence of CAD. 4. CAD-RADS 0. Consider non-atherosclerotic causes of chest pain.    EKG:  EKG is ordered today.  The EKG ordered today demonstrates NSR, 67 bpm, poor R wave progression along the precordial leads, low voltage QRS, no acute ST-T changes, consistent with prior tracing  Recent Labs: 09/16/2022: BUN 13; Creatinine, Ser 0.79; Potassium 4.9; Sodium 143  Recent Lipid Panel    Component Value Date/Time   CHOL 164 02/12/2022 0946   TRIG 158 (H) 02/12/2022 0946   HDL 52 02/12/2022 0946   CHOLHDL 3.2 02/12/2022 0946   VLDL 32 02/12/2022 0946   LDLCALC 80 02/12/2022 0946   LDLDIRECT 100 (H) 02/12/2022 0946    PHYSICAL EXAM:    VS:  BP 130/80 (BP Location: Left Arm, Patient Position: Sitting, Cuff Size: Large)   Pulse 67   Ht 5\' 5"  (1.651 m)   Wt 240 lb (108.9 kg)   SpO2 94%   BMI 39.94 kg/m   BMI: Body mass index is 39.94 kg/m.  Physical Exam Vitals reviewed.  Constitutional:      Appearance: She is well-developed.  HENT:     Head:  Normocephalic and atraumatic.  Eyes:     General:        Right eye: No discharge.        Left eye: No discharge.  Neck:     Vascular: No JVD.  Cardiovascular:     Rate and Rhythm: Normal rate and regular rhythm.     Heart sounds: Normal heart sounds, S1 normal and S2 normal. Heart sounds not distant. No midsystolic click and no opening snap. No murmur heard.    No friction rub.  Pulmonary:     Effort: Pulmonary effort is normal. No respiratory distress.     Breath sounds: Normal breath sounds. No decreased breath sounds, wheezing, rhonchi or rales.  Chest:     Chest wall: No tenderness.  Musculoskeletal:     Cervical back: Normal range of motion.     Right lower leg: Edema present.     Left lower leg: Edema present.     Comments: 1+ bilateral pitting edema to the knee with mild erythema along the anterior aspect of the left lower extremity.  Slight tenderness to palpation in the left popliteal fossa.  Skin:    General: Skin is warm and dry.     Nails: There is no clubbing.  Neurological:     Mental Status: She is alert and oriented to person, place, and time.  Psychiatric:        Speech: Speech normal.        Behavior: Behavior normal.        Thought Content: Thought content normal.        Judgment: Judgment normal.     Wt Readings from Last 3 Encounters:  05/05/23 240 lb (108.9 kg)  09/16/22 234 lb (106.1 kg)  02/12/22 231 lb 9.6 oz (105.1 kg)     ASSESSMENT & PLAN:   Nonobstructive CAD: No symptoms suggestive of angina.  Recent coronary CTA with a calcium  score of 0.  Continue aggressive risk factor modification and primary prevention including aspirin  81 mg, rosuvastatin  20 mg, carvedilol  25 mg twice daily, and losartan  50 mg.  No indication for further ischemic testing at this time.  Lower extremity swelling: She notes a longstanding history of lower extremity swelling that has been more progressive over the past 2 weeks.  Her weight is up 6 pounds today when compared  to her visit in 09/2022.  She has been taking furosemide 20 mg daily for the past 2 weeks, which can be continued for now pending updated labs.  Wells score 1.  Obtain stat bilateral lower extremity venous duplex for DVT evaluation.  Schedule echo.  Check CMP.  Leg elevation.  If lower extremity venous duplex is negative for DVT recommend compression socks at 15 mmHg.  Cannot exclude lymphedema.  HTN: Blood pressure is reasonably controlled in the office today.  Continue carvedilol  25 mg twice daily and losartan  50 mg.  HLD: LDL 80 in 02/2022.  She remains on rosuvastatin  20 mg.  Checking CMP, lipid panel, and direct LDL.  Obesity with OSA: Weight loss is encouraged through heart healthy diet and regular exercise.  She is in the process of being fitted for dental appliance for sleep apnea.     Disposition: F/u with Dr. Junnie Olives or an APP in 2 months.   Medication Adjustments/Labs and Tests Ordered: Current medicines are reviewed at length with the patient today.  Concerns regarding medicines are outlined above. Medication changes, Labs and Tests ordered today are summarized above and listed in the Patient Instructions accessible in Encounters.   Signed, Varney Gentleman, PA-C 05/05/2023 9:21 AM     [facility] [place] [address] [place], [place] [address] [phone]

## 2023-05-05 ENCOUNTER — Other Ambulatory Visit: Payer: Self-pay | Admitting: Physician Assistant

## 2023-05-05 ENCOUNTER — Ambulatory Visit
Admission: RE | Admit: 2023-05-05 | Discharge: 2023-05-05 | Disposition: A | Discharge status: Home / Self Care | Source: Ambulatory Visit | Attending: Physician Assistant | Admitting: Physician Assistant

## 2023-05-05 ENCOUNTER — Encounter: Payer: Self-pay | Admitting: Physician Assistant

## 2023-05-05 ENCOUNTER — Ambulatory Visit: Attending: Physician Assistant | Admitting: Physician Assistant

## 2023-05-05 VITALS — BP 130/80 | HR 67 | Ht 65.0 in | Wt 240.0 lb

## 2023-05-05 DIAGNOSIS — M7989 Other specified soft tissue disorders: Secondary | ICD-10-CM

## 2023-05-05 DIAGNOSIS — Z6839 Body mass index (BMI) 39.0-39.9, adult: Secondary | ICD-10-CM

## 2023-05-05 DIAGNOSIS — Z79899 Other long term (current) drug therapy: Secondary | ICD-10-CM

## 2023-05-05 DIAGNOSIS — I10 Essential (primary) hypertension: Secondary | ICD-10-CM | POA: Diagnosis not present

## 2023-05-05 DIAGNOSIS — E66812 Obesity, class 2: Secondary | ICD-10-CM

## 2023-05-05 DIAGNOSIS — E785 Hyperlipidemia, unspecified: Secondary | ICD-10-CM

## 2023-05-05 DIAGNOSIS — G4733 Obstructive sleep apnea (adult) (pediatric): Secondary | ICD-10-CM

## 2023-05-05 DIAGNOSIS — I2510 Atherosclerotic heart disease of native coronary artery without angina pectoris: Secondary | ICD-10-CM

## 2023-05-05 NOTE — Patient Instructions (Addendum)
 Medication Instructions:  Your Physician recommend you continue on your current medication as directed.    *If you need a refill on your cardiac medications before your next appointment, please call your pharmacy*  Lab Work: Your provider would like for you to have following labs drawn today CMeT, Direct LDL and Lipid panel.   If you have labs (blood work) drawn today and your tests are completely normal, you will receive your results only by: MyChart Message (if you have MyChart) OR A paper copy in the mail If you have any lab test that is abnormal or we need to change your treatment, we will call you to review the results.  Testing/Procedures: Lower extremity Venous US  Echo  Your physician has requested that you have a lower extremity venous duplex. This test is an ultrasound of the veins in the legs or arms. It looks at venous blood flow that carries blood from the heart to the legs or arms. Allow one hour for a Lower Venous exam. Allow thirty minutes for an Upper Venous exam. There are no restrictions or special instructions.  Your physician has requested that you have an echocardiogram. Echocardiography is a painless test that uses sound waves to create images of your heart. It provides your doctor with information about the size and shape of your heart and how well your heart's chambers and valves are working.   You may receive an ultrasound enhancing agent through an IV if needed to better visualize your heart during the echo. This procedure takes approximately one hour.  There are no restrictions for this procedure.  This will take place at [address] [facility]) [address], [place] [address]  Please note: We ask at that you not bring children with you during ultrasound (echo/ vascular) testing. Due to room size and safety concerns, children are not allowed in the ultrasound rooms during exams. Our front office staff cannot provide observation of children in our lobby  area while testing is being conducted. An adult accompanying a patient to their appointment will only be allowed in the ultrasound room at the discretion of the ultrasound technician under special circumstances. We apologize for any inconvenience.   Follow-Up: At [facility], you and your health needs are our priority.  As part of our continuing mission to provide you with exceptional heart care, our providers are all part of one team.  This team includes your primary Cardiologist (physician) and Advanced Practice Providers or APPs (Physician Assistants and Nurse Practitioners) who all work together to provide you with the care you need, when you need it.  Your next appointment:   2 month(s)  Provider:   Constancia Delton, MD or Varney Gentleman, PA-C

## 2023-05-06 LAB — COMPREHENSIVE METABOLIC PANEL WITH GFR
ALT: 22 IU/L (ref 0–32)
AST: 21 IU/L (ref 0–40)
Albumin: 4.3 g/dL (ref 3.9–4.9)
Alkaline Phosphatase: 61 IU/L (ref 44–121)
BUN/Creatinine Ratio: 17 (ref 12–28)
BUN: 13 mg/dL (ref 8–27)
Bilirubin Total: 0.4 mg/dL (ref 0.0–1.2)
CO2: 23 mmol/L (ref 20–29)
Calcium: 9.9 mg/dL (ref 8.7–10.3)
Chloride: 108 mmol/L — ABNORMAL HIGH (ref 96–106)
Creatinine, Ser: 0.78 mg/dL (ref 0.57–1.00)
Globulin, Total: 2.3 g/dL (ref 1.5–4.5)
Glucose: 139 mg/dL — ABNORMAL HIGH (ref 70–99)
Potassium: 4.7 mmol/L (ref 3.5–5.2)
Sodium: 146 mmol/L — ABNORMAL HIGH (ref 134–144)
Total Protein: 6.6 g/dL (ref 6.0–8.5)
eGFR: 85 mL/min/{1.73_m2} (ref >59)

## 2023-05-06 LAB — LDL CHOLESTEROL, DIRECT: LDL Direct: 58 mg/dL (ref 0–99)

## 2023-05-06 LAB — LIPID PANEL
Chol/HDL Ratio: 2.8 ratio (ref 0.0–4.4)
Cholesterol, Total: 123 mg/dL (ref 100–199)
HDL: 44 mg/dL (ref >39)
LDL Chol Calc (NIH): 54 mg/dL (ref 0–99)
Triglycerides: 143 mg/dL (ref 0–149)
VLDL Cholesterol Cal: 25 mg/dL (ref 5–40)

## 2023-06-15 ENCOUNTER — Other Ambulatory Visit

## 2023-06-25 ENCOUNTER — Ambulatory Visit: Attending: Physician Assistant

## 2023-06-25 DIAGNOSIS — M7989 Other specified soft tissue disorders: Secondary | ICD-10-CM

## 2023-06-25 DIAGNOSIS — I2510 Atherosclerotic heart disease of native coronary artery without angina pectoris: Secondary | ICD-10-CM

## 2023-06-25 LAB — ECHOCARDIOGRAM COMPLETE
Area-P 1/2: 3.03 cm2
S' Lateral: 2.43 cm

## 2023-06-26 ENCOUNTER — Ambulatory Visit: Payer: Self-pay | Admitting: Physician Assistant

## 2023-07-05 NOTE — Progress Notes (Unsigned)
 Cardiology Office Note    Date:  07/08/2023   ID:  Claire Miller, DOB July 02, 1960, MRN [id number]  PCP:  Henriette Anes, DO  Cardiologist:  Redell Cave, MD  Electrophysiologist:  None   Chief Complaint: Follow-up  History of Present Illness:   Claire Miller is a 63 y.o. female with history of minimal nonobstructive coronary artery calcification by coronary CTA in 03/2019 and normal coronary arteries by coronary CTA in 09/2022, HTN, HLD, obesity, OSA intolerant to CPAP, GERD, and osteoarthritis who presents for follow-up of echo.  She was evaluated for chest pain in 02/2019.  Echo at that time demonstrated an EF of 60 to 65%, no regional wall motion abnormalities, mild LVH, grade 1 diastolic dysfunction, normal RV systolic function and ventricular cavity size, no significant valvular abnormalities, and an estimated right atrial pressure of 3 mmHg.  Coronary CTA in 03/2019 showed no significant extracardiac findings with a calcium  score of 13.7, which was the 73rd percentile.  There was minimal coronary artery calcification in the ostial left main.  She was seen in the office in 08/2020 continuing to note occasional chest discomfort associated with exertion as well as daytime fatigue, sleepiness, and exertional dyspnea.  She was subsequently diagnosed with sleep apnea.  PFTs in 01/2021 were unrevealing.  She was seen in the office in 09/2021 and noted elevations in her BP ranging from 130s to 170s systolic.  She was transitioned from Toprol -XL to carvedilol  and advised to discontinue NSAIDs.  It was also noted her underlying sleep apnea, with intolerance to CPAP, was likely contributing to her elevated BP.  She was referred to sleep medicine.  She was subsequently referred to ENT for initial evaluation of potential inspire device with recommendation for patient to lose weight before she would likely become a candidate.     She was seen at an outside ED on 08/21/2022 with a several week history of  intermittent chest pain that was at times postprandial, particularly after eating tomato, and other times occurred at rest with associated diaphoresis, lightheadedness, and nausea lasting several minutes with spontaneous resolution.  High-sensitivity troponin negative.  ProBNP 86.  Chest x-ray without acute cardiopulmonary process.  She was discharged with Carafate.   She was seen in the office on 09/16/2022 and reported no further symptoms since initiating Carafate.  Subsequent coronary CTA in 09/2022 showed a calcium  score of 0 with no evidence of CAD.  She was last seen in the office in 04/2023 and remained without symptoms of angina or cardiac decompensation.  She reported progressive lower extremity swelling over the preceding 2 weeks with left lower extremity being slightly worse than the right.  In this setting she had been taking furosemide 20 mg daily for the preceding 2 weeks.  Venous duplex negative for DVT bilaterally in 04/2023.  Echo in 06/2023 showed an EF of 60 to 65%, no regional wall motion abnormalities, grade 1 diastolic dysfunction, normal RV systolic function and ventricular cavity size, moderately dilated left atrium, and an estimated right atrial pressure of 3 mmHg.  She comes in doing well from a cardiac perspective and is without symptoms of angina or cardiac decompensation.  No progressive dyspnea or orthopnea.  Lower extremity swelling is stable.  She does elevate her legs in the evening hours.  Has difficult time getting compression socks on.  She also wonders if lower extremity swelling is coming from possible arthritic changes in her knees. Her weight is down 3 pounds today when compared to her visit  in 04/2023.  No dizziness, presyncope, or syncope.  Labs independently reviewed: 04/2023 - TC 123, TG 143, HDL 44, LDL 54, BUN 13, serum creatinine 0.70, potassium 4.7, albumin 4.3, AST/ALT normal 08/2022 - Hgb 14.4, PLT 200, magnesium  2.1 08/2020 - TSH normal  Past Medical History:   Diagnosis Date   Chest pain    a. 03/2019 Cor CTA: Nl cors. cor Ca2+ = 13.7 (73rd %'ile). No significant non-cardiac findings.   Complication of anesthesia 2009   woke up during foot surgery    Diastolic dysfunction    a. 03/2019 Echo: EF 60-65%, no rwma, Gr1 DD, nl RV fxn.   Gastric polyps    Gastritis    Headache    Hiatal hernia    History of kidney stones    Hyperlipidemia    Hypertension    Kidney stones     Past Surgical History:  Procedure Laterality Date   BACK SURGERY  01/09/1988   CARPAL TUNNEL RELEASE Left    CHOLECYSTECTOMY  2012   ESOPHAGOGASTRODUODENOSCOPY N/A 04/11/2020   Procedure: ESOPHAGOGASTRODUODENOSCOPY (EGD);  Surgeon: Toledo, Ladell POUR, MD;  Location: [facility] ENDOSCOPY;  Service: Gastroenterology;  Laterality: N/A;   FOOT SURGERY Bilateral    TOTAL KNEE ARTHROPLASTY Right 06/16/2019   Procedure: TOTAL KNEE ARTHROPLASTY;  Surgeon: Edie Norleen PARAS, MD;  Location: [facility] ORS;  Service: Orthopedics;  Laterality: Right;    Current Medications: Current Meds  Medication Sig   aspirin  EC 81 MG tablet Take 81 mg by mouth daily. Swallow whole.   carvedilol  (COREG ) 25 MG tablet TAKE 1 TABLET BY MOUTH TWICE A DAY   cholecalciferol  (VITAMIN D3) 25 MCG (1000 UNIT) tablet Take 1,000 Units by mouth at bedtime.   Coenzyme Q10 (CO Q 10 PO) Take by mouth daily at 2 am.   famotidine  (PEPCID ) 20 MG tablet One after supper   furosemide (LASIX) 20 MG tablet Take 20 mg by mouth daily.   ibuprofen (ADVIL) 200 MG tablet Take 400 mg by mouth every 8 (eight) hours as needed (for pain.).   losartan  (COZAAR ) 50 MG tablet Take 1 tablet (50 mg total) by mouth daily.   Magnesium  200 MG TABS Take by mouth daily at 2 am.   Multiple Vitamin (MULTIVITAMIN) tablet Take 1 tablet by mouth daily.   pantoprazole  (PROTONIX ) 40 MG tablet TAKE 1 TABLET (40 MG TOTAL) BY MOUTH DAILY. TAKE 30-60 MIN BEFORE FIRST MEAL OF THE DAY   Riboflavin 25 MG TABS Take 50 mg by mouth daily at 2 am.   rosuvastatin   (CRESTOR ) 20 MG tablet TAKE 1 TABLET BY MOUTH EVERY DAY   sucralfate (CARAFATE) 1 g tablet Take 1 g by mouth 4 (four) times daily -  with meals and at bedtime.   TURMERIC PO Take by mouth daily in the afternoon.    Allergies:   Other and Sulfa antibiotics   Social History   Socioeconomic History   Marital status: Married    Spouse name: Not on file   Number of children: Not on file   Years of education: Not on file   Highest education level: Not on file  Occupational History   Not on file  Tobacco Use   Smoking status: Never   Smokeless tobacco: Never  Vaping Use   Vaping status: Never Used  Substance and Sexual Activity   Alcohol use: Never   Drug use: Never   Sexual activity: Not on file  Other Topics Concern   Not on file  Social History  Narrative   Not on file   Social Drivers of Health   Financial Resource Strain: Low Risk  (01/24/2022)   Received [facility]   Overall Financial Resource Strain (CARDIA)    Difficulty of Paying Living Expenses: Not hard at all  Food Insecurity: No Food Insecurity (01/24/2022)   Received from [facility]   Hunger Vital Sign    Within the past 12 months, you worried that your food would run out before you got the money to buy more.: Never true    Within the past 12 months, the food you bought just didn't last and you didn't have money to get more.: Never true  Transportation Needs: No Transportation Needs (01/24/2022)   Received from [facility] - Transportation    Lack of Transportation (Medical): No    Lack of Transportation (Non-Medical): No  Physical Activity: Unknown (01/24/2022)   Received from [facility]   Exercise Vital Sign    On average, how many days per week do you engage in moderate to strenuous exercise (like a brisk walk)?: 0 days    Minutes of Exercise per Session: Not on file  Stress: No Stress Concern Present (01/24/2022)   Received from [facility] - Occupational Stress Questionnaire    Feeling of Stress : Only a little  Social Connections: Moderately Integrated (01/24/2022)   Received from [facility]   Social Network    How would you rate your social network (family, work, friends)?: Adequate participation with social networks     Family History:  The patient's family history is not on file.  ROS:   12-point review of systems is negative unless otherwise noted in the HPI.   EKGs/Labs/Other Studies Reviewed:    Studies reviewed were summarized above. The additional studies were reviewed today:  2D echo 03/28/2019: 1. Left ventricular ejection fraction, by estimation, is 60 to 65%. The  left ventricle has normal function. The left ventricle has no regional  wall motion abnormalities. There is mild left ventricular hypertrophy.  Left ventricular diastolic parameters  are consistent with Grade I diastolic dysfunction (impaired relaxation).   2. Right ventricular systolic function is normal. The right ventricular  size is normal. Tricuspid regurgitation signal is inadequate for assessing  PA pressure.   3. The mitral valve is normal in structure. No evidence of mitral valve  regurgitation. No evidence of mitral stenosis.   4. The aortic valve is normal in structure. Aortic valve regurgitation is  not visualized. No aortic stenosis is present.   5. The inferior vena cava is normal in size with greater than 50%  respiratory variability, suggesting right atrial pressure of 3 mmHg. __________   Coronary CTA 03/31/2019: Aorta: Normal size. Minimal aortic root calcifications. No dissection.   Aortic Valve:  Trileaflet.  No calcifications.   Coronary Arteries:  Normal coronary origin.  Right dominance.   RCA is a large dominant artery that gives rise to PDA and PLA. There is no plaque.   Left main is a large artery that gives rise to LAD and LCX arteries.   LAD is a large vessel that has no plaque.   LCX is a  non-dominant artery that gives rise to an OM1 branch. There is no plaque.   Other findings:   Normal pulmonary vein drainage into the left atrium.   Normal left atrial appendage without a thrombus.   Normal size of the pulmonary artery.  IMPRESSION: 1. Coronary calcium  score of 13.7. Minimal coronary calcification. this was 73rd percentile for age and sex matched control. 2. Normal coronary origin with right dominance. 3. Minimal coronary calcification in the ostial LM 4. CAD-RADS 1. Minimal non-obstructive CAD (0-24%). Consider non-atherosclerotic causes of chest pain. Consider preventive therapy and risk factor modification. __________   Coronary CTA 09/25/2022: FINDINGS: Aorta:  Normal size.  No calcifications.  No dissection.   Aortic Valve:  Trileaflet.  No calcifications.   Mitral annular calcifications.   Coronary Arteries:  Normal coronary origin.  Right dominance.   RCA is a dominant artery. There is no plaque.   Left main gives rise to LAD and LCX arteries. LM has no disease.   LAD has no plaque.   LCX is a non-dominant artery.  There is no plaque.   Other findings:   Normal pulmonary vein drainage into the left atrium.   Normal left atrial appendage without a thrombus.   Normal size of the pulmonary artery.   IMPRESSION: 1. Coronary calcium  score of 0. 2. Normal coronary origin with right dominance. 3. No evidence of CAD. 4. CAD-RADS 0. Consider non-atherosclerotic causes of chest pain. __________  2D echo 06/25/2023: 1. Left ventricular ejection fraction, by estimation, is 60 to 65%. The  left ventricle has normal function. The left ventricle has no regional  wall motion abnormalities. Left ventricular diastolic parameters are  consistent with Grade I diastolic  dysfunction (impaired relaxation).   2. Right ventricular systolic function is normal. The right ventricular  size is normal. Tricuspid regurgitation signal is inadequate for assessing   PA pressure.   3. Left atrial size was moderately dilated.   4. The mitral valve is normal in structure. No evidence of mitral valve  regurgitation. No evidence of mitral stenosis.   5. The aortic valve has an indeterminant number of cusps. Aortic valve  regurgitation is not visualized. No aortic stenosis is present.   6. The inferior vena cava is normal in size with greater than 50%  respiratory variability, suggesting right atrial pressure of 3 mmHg.    EKG:  EKG is not ordered today.    Recent Labs: 05/05/2023: ALT 22; BUN 13; Creatinine, Ser 0.78; Potassium 4.7; Sodium 146  Recent Lipid Panel    Component Value Date/Time   CHOL 123 05/05/2023 0903   TRIG 143 05/05/2023 0903   HDL 44 05/05/2023 0903   CHOLHDL 2.8 05/05/2023 0903   CHOLHDL 3.2 02/12/2022 0946   VLDL 32 02/12/2022 0946   LDLCALC 54 05/05/2023 0903   LDLDIRECT 58 05/05/2023 0903   LDLDIRECT 100 (H) 02/12/2022 0946    PHYSICAL EXAM:    VS:  BP 110/78 (BP Location: Left Arm, Patient Position: Sitting, Cuff Size: Normal)   Pulse 68   Ht 5' 6 (1.676 m)   Wt 237 lb (107.5 kg)   SpO2 92%   BMI 38.25 kg/m   BMI: Body mass index is 38.25 kg/m.  Physical Exam Vitals reviewed.  Constitutional:      Appearance: She is well-developed.  HENT:     Head: Normocephalic and atraumatic.  Eyes:     General:        Right eye: No discharge.        Left eye: No discharge.  Cardiovascular:     Rate and Rhythm: Normal rate and regular rhythm.     Heart sounds: Normal heart sounds, S1 normal and S2 normal. Heart sounds not distant. No midsystolic click and no opening  snap. No murmur heard.    No friction rub.  Pulmonary:     Effort: Pulmonary effort is normal. No respiratory distress.     Breath sounds: Normal breath sounds. No decreased breath sounds, wheezing, rhonchi or rales.  Chest:     Chest wall: No tenderness.  Musculoskeletal:     Cervical back: Normal range of motion.     Comments: Mild bilateral  pretibial edema.  Skin:    General: Skin is warm and dry.     Nails: There is no clubbing.  Neurological:     Mental Status: She is alert and oriented to person, place, and time.  Psychiatric:        Speech: Speech normal.        Behavior: Behavior normal.        Thought Content: Thought content normal.        Judgment: Judgment normal.     Wt Readings from Last 3 Encounters:  07/08/23 237 lb (107.5 kg)  05/05/23 240 lb (108.9 kg)  09/16/22 234 lb (106.1 kg)     ASSESSMENT & PLAN:   Nonobstructive CAD: No symptoms suggestive of angina or cardiac decompensation.  Recent coronary CTA with a calcium  score of 0 with no evidence of CAD.  Continue aggressive risk factor modification and primary prevention including aspirin  81 mg, rosuvastatin  20 mg, and optimal blood pressure control.  No indication for further ischemic testing at this time.  Lower extremity swelling: Longstanding issue that is largely stable.  Recent echo with preserved LV systolic function with only grade 1 diastolic dysfunction.  Lower extremity ultrasound negative for DVT bilaterally.  Query if some of her lower extremity swelling is related to sodium intake as well.  She remains on low-dose furosemide 20 mg daily with stable labs.  Recommend leg elevation and compression socks.  HTN: Blood pressure is well-controlled in the office today.  She remains on carvedilol  25 mg twice daily and losartan  50 mg.  HLD: LDL 54 in 04/2023.  She remains on rosuvastatin  20 mg.  Obesity with OSA: Her weight is down 3 pounds today when compared to her visit in 04/2023.  Weight loss is encouraged through heart healthy diet and regular exercise.  Working with dental appliance for sleep apnea.     Disposition: F/u with Dr. Darliss or an APP in 6 months.   Medication Adjustments/Labs and Tests Ordered: Current medicines are reviewed at length with the patient today.  Concerns regarding medicines are outlined above. Medication  changes, Labs and Tests ordered today are summarized above and listed in the Patient Instructions accessible in Encounters.   Signed, Bernardino Bring, PA-C 07/08/2023 12:45 PM     [facility] [place] [address] [place], [place] [address] [phone]

## 2023-07-08 ENCOUNTER — Encounter: Payer: Self-pay | Admitting: Physician Assistant

## 2023-07-08 ENCOUNTER — Ambulatory Visit: Attending: Physician Assistant | Admitting: Physician Assistant

## 2023-07-08 VITALS — BP 110/78 | HR 68 | Ht 66.0 in | Wt 237.0 lb

## 2023-07-08 DIAGNOSIS — I2510 Atherosclerotic heart disease of native coronary artery without angina pectoris: Secondary | ICD-10-CM

## 2023-07-08 DIAGNOSIS — Z6839 Body mass index (BMI) 39.0-39.9, adult: Secondary | ICD-10-CM

## 2023-07-08 DIAGNOSIS — E785 Hyperlipidemia, unspecified: Secondary | ICD-10-CM

## 2023-07-08 DIAGNOSIS — M7989 Other specified soft tissue disorders: Secondary | ICD-10-CM

## 2023-07-08 DIAGNOSIS — G4733 Obstructive sleep apnea (adult) (pediatric): Secondary | ICD-10-CM

## 2023-07-08 DIAGNOSIS — E66812 Obesity, class 2: Secondary | ICD-10-CM

## 2023-07-08 DIAGNOSIS — I10 Essential (primary) hypertension: Secondary | ICD-10-CM | POA: Diagnosis not present

## 2023-07-08 NOTE — Patient Instructions (Signed)
 Medication Instructions:  Your physician recommends that you continue on your current medications as directed. Please refer to the Current Medication list given to you today.   *If you need a refill on your cardiac medications before your next appointment, please call your pharmacy*  Lab Work: None ordered at this time   Follow-Up: At [facility], you and your health needs are our priority.  As part of our continuing mission to provide you with exceptional heart care, our providers are all part of one team.  This team includes your primary Cardiologist (physician) and Advanced Practice Providers or APPs (Physician Assistants and Nurse Practitioners) who all work together to provide you with the care you need, when you need it.  Your next appointment:   6 month(s)  Provider:   You may see Redell Cave, MD or Bernardino Bring, PA-C  We recommend signing up for the patient portal called MyChart.  Sign up information is provided on this After Visit Summary.  MyChart is used to connect with patients for Virtual Visits (Telemedicine).  Patients are able to view lab/test results, encounter notes, upcoming appointments, etc.  Non-urgent messages can be sent to your provider as well.   To learn more about what you can do with MyChart, go to [email or web address].

## 2023-09-11 ENCOUNTER — Other Ambulatory Visit: Payer: Self-pay | Admitting: Physician Assistant

## 2023-12-16 ENCOUNTER — Other Ambulatory Visit: Payer: Self-pay | Admitting: Physician Assistant

## 2024-01-19 NOTE — Progress Notes (Unsigned)
 "  Cardiology Office Note    Date:  01/22/2024   ID:  Claire Miller, DOB September 12, 1960, MRN [id number]  PCP:  Henriette Anes, DO  Cardiologist:  Redell Cave, MD  Electrophysiologist:  None   Chief Complaint: Follow up  History of Present Illness:   Claire Miller is a 64 y.o. female with history of minimal nonobstructive coronary artery calcification by coronary CTA in 03/2019 and normal coronary arteries by coronary CTA in 09/2022, HTN, HLD, obesity, OSA intolerant to CPAP, GERD, and osteoarthritis who presents for follow-up of CAD, HTN, and HLD.  She was evaluated for chest pain in 02/2019.  Echo at that time demonstrated an EF of 60 to 65%, no regional wall motion abnormalities, mild LVH, grade 1 diastolic dysfunction, normal RV systolic function and ventricular cavity size, no significant valvular abnormalities, and an estimated right atrial pressure of 3 mmHg.  Coronary CTA in 03/2019 showed no significant extracardiac findings with a calcium  score of 13.7, which was the 73rd percentile.  There was minimal coronary artery calcification in the ostial left main.  She was seen in the office in 08/2020 continuing to note occasional chest discomfort associated with exertion as well as daytime fatigue, sleepiness, and exertional dyspnea.  She was subsequently diagnosed with sleep apnea.  PFTs in 01/2021 were unrevealing.  She was seen in the office in 09/2021 and noted elevations in her BP ranging from 130s to 170s systolic.  She was transitioned from Toprol -XL to carvedilol  and advised to discontinue NSAIDs.  It was also noted her underlying sleep apnea, with intolerance to CPAP, was likely contributing to her elevated BP.  She was referred to sleep medicine.  She was subsequently referred to ENT for initial evaluation of potential inspire device with recommendation for patient to lose weight before she would likely become a candidate.     She was seen at an outside ED on 08/21/2022 with a several week  history of intermittent chest pain that was at times postprandial, particularly after eating tomato, and other times occurred at rest with associated diaphoresis, lightheadedness, and nausea lasting several minutes with spontaneous resolution.  High-sensitivity troponin negative.  ProBNP 86.  Chest x-ray without acute cardiopulmonary process.  She was discharged with Carafate.   She was seen in the office on 09/16/2022 and reported no further symptoms since initiating Carafate.  Subsequent coronary CTA in 09/2022 showed a calcium  score of 0 with no evidence of CAD.   She was seen in the office in 04/2023 and reported progressive lower extremity swelling over the preceding 2 weeks with left lower extremity being slightly worse than the right.  In this setting she had been taking furosemide 20 mg daily for the preceding 2 weeks.  Venous duplex negative for DVT bilaterally in 04/2023.  Echo in 06/2023 showed an EF of 60 to 65%, no regional wall motion abnormalities, grade 1 diastolic dysfunction, normal RV systolic function and ventricular cavity size, moderately dilated left atrium, and an estimated right atrial pressure of 3 mmHg.  She was last seen in the office in 07/2023 noting stable lower extremity swelling with recommendation to continue leg elevation, compression socks, restrict sodium, and remain on furosemide 20 mg daily.  She comes in today continuing to do very well from a cardiac perspective, without symptoms of angina or cardiac decompensation.  No dizziness, presyncope, or syncope.  No significant lower extremity swelling or progressive orthopnea.  No falls or symptoms concerning for bleeding.  Her weight is down 15 pounds  today when compared to her visit in 07/2023.  This is intentional with lifestyle modification.  In this setting, she noted some positional dizziness and lower blood pressures prompting her to take her carvedilol  once daily.  Since she was last seen she did not lose her mother,  ultimately attributed to lymphoma.  Overall, she feels well from a cardiac perspective and does not have any acute cardiac concerns at this time.   Labs independently reviewed: 10/2023 - Hgb 14.1, PLT 177, potassium 4.0, BUN 13, serum creatinine 1.07, albumin 3.6, AST/ALT normal, magnesium  1.9 04/2023 - TC 123, TG 143, HDL 44, LDL 54 08/2020 - TSH normal  Past Medical History:  Diagnosis Date   Chest pain    a. 03/2019 Cor CTA: Nl cors. cor Ca2+ = 13.7 (73rd %'ile). No significant non-cardiac findings.   Complication of anesthesia 2009   woke up during foot surgery    Diastolic dysfunction    a. 03/2019 Echo: EF 60-65%, no rwma, Gr1 DD, nl RV fxn.   Gastric polyps    Gastritis    Headache    Hiatal hernia    History of kidney stones    Hyperlipidemia    Hypertension    Kidney stones     Past Surgical History:  Procedure Laterality Date   BACK SURGERY  01/09/1988   CARPAL TUNNEL RELEASE Left    CHOLECYSTECTOMY  2012   ESOPHAGOGASTRODUODENOSCOPY N/A 04/11/2020   Procedure: ESOPHAGOGASTRODUODENOSCOPY (EGD);  Surgeon: Toledo, Ladell POUR, MD;  Location: [facility] ENDOSCOPY;  Service: Gastroenterology;  Laterality: N/A;   FOOT SURGERY Bilateral    TOTAL KNEE ARTHROPLASTY Right 06/16/2019   Procedure: TOTAL KNEE ARTHROPLASTY;  Surgeon: Edie Norleen PARAS, MD;  Location: [facility] ORS;  Service: Orthopedics;  Laterality: Right;    Current Medications: Active Medications[1]  Allergies:   Other and Sulfa antibiotics   Social History   Socioeconomic History   Marital status: Married    Spouse name: Not on file   Number of children: Not on file   Years of education: Not on file   Highest education level: Not on file  Occupational History   Not on file  Tobacco Use   Smoking status: Never   Smokeless tobacco: Never  Vaping Use   Vaping status: Never Used  Substance and Sexual Activity   Alcohol use: Never   Drug use: Never   Sexual activity: Not on file  Other Topics Concern   Not on file   Social History Narrative   Not on file   Social Drivers of Health   Tobacco Use: Low Risk (01/22/2024)   Patient History    Smoking Tobacco Use: Never    Smokeless Tobacco Use: Never    Passive Exposure: Not on file  Financial Resource Strain: Low Risk  (11/06/2023)   Received from [facility]   Overall Financial Resource Strain (CARDIA)    Difficulty of Paying Living Expenses: Not hard at all  Food Insecurity: No Food Insecurity (11/06/2023)   Received from [facility]   Epic    Within the past 12 months, you worried that your food would run out before you got the money to buy more.: Never true    Within the past 12 months, the food you bought just didn't last and you didn't have money to get more.: Never true  Transportation Needs: No Transportation Needs (11/06/2023)   Received from [facility] - Transportation    In the  past 12 months, has lack of transportation kept you from medical appointments or from getting medications?: No    Lack of Transportation (Non-Medical): No  Physical Activity: Unknown (01/24/2022)   Received from [facility]   Exercise Vital Sign    On average, how many days per week do you engage in moderate to strenuous exercise (like a brisk walk)?: 0 days    Minutes of Exercise per Session: Not on file  Stress: No Stress Concern Present (01/24/2022)   Received from [facility] - Occupational Stress Questionnaire    Feeling of Stress : Only a little  Social Connections: Moderately Integrated (01/24/2022)   Received from [facility]   Social Network    How would you rate your social network (family, work, friends)?: Adequate participation with social networks  Depression (PHQ2-9): Not on file  Alcohol Screen: Not on file  Housing: Unknown (11/06/2023)   Received from [facility]   Epic    In the last 12 months, was there a time  when you were not able to pay the mortgage or rent on time?: No    Number of Times Moved in the Last Year: Not on file    At any time in the past 12 months, were you homeless or living in a shelter (including now)?: No  Utilities: Not At Risk (11/06/2023)   Received from [facility]   Epic    In the past 12 months has the electric, gas, oil, or water company threatened to shut off services in your home?: No  Health Literacy: Low Risk (10/28/2023)   Received from [facility] Literacy    How often do you need to have someone help you when you read instructions, pamphlets, or other written material from your doctor or pharmacy?: Never     Family History:  The patient's family history is not on file.  ROS:   12-point review of systems is negative unless otherwise noted in the HPI.   EKGs/Labs/Other Studies Reviewed:    Studies reviewed were summarized above. The additional studies were reviewed today:  2D echo 03/28/2019: 1. Left ventricular ejection fraction, by estimation, is 60 to 65%. The  left ventricle has normal function. The left ventricle has no regional  wall motion abnormalities. There is mild left ventricular hypertrophy.  Left ventricular diastolic parameters  are consistent with Grade I diastolic dysfunction (impaired relaxation).   2. Right ventricular systolic function is normal. The right ventricular  size is normal. Tricuspid regurgitation signal is inadequate for assessing  PA pressure.   3. The mitral valve is normal in structure. No evidence of mitral valve  regurgitation. No evidence of mitral stenosis.   4. The aortic valve is normal in structure. Aortic valve regurgitation is  not visualized. No aortic stenosis is present.   5. The inferior vena cava is normal in size with greater than 50%  respiratory variability, suggesting right atrial pressure of 3 mmHg. __________   Coronary CTA 03/31/2019: Aorta: Normal size. Minimal  aortic root calcifications. No dissection.   Aortic Valve:  Trileaflet.  No calcifications.   Coronary Arteries:  Normal coronary origin.  Right dominance.   RCA is a large dominant artery that gives rise to PDA and PLA. There is no plaque.   Left main is a large artery that gives rise to LAD and LCX arteries.   LAD is a large vessel that has no  plaque.   LCX is a non-dominant artery that gives rise to an OM1 branch. There is no plaque.   Other findings:   Normal pulmonary vein drainage into the left atrium.   Normal left atrial appendage without a thrombus.   Normal size of the pulmonary artery.   IMPRESSION: 1. Coronary calcium  score of 13.7. Minimal coronary calcification. this was 73rd percentile for age and sex matched control. 2. Normal coronary origin with right dominance. 3. Minimal coronary calcification in the ostial LM 4. CAD-RADS 1. Minimal non-obstructive CAD (0-24%). Consider non-atherosclerotic causes of chest pain. Consider preventive therapy and risk factor modification. __________   Coronary CTA 09/25/2022: FINDINGS: Aorta:  Normal size.  No calcifications.  No dissection.   Aortic Valve:  Trileaflet.  No calcifications.   Mitral annular calcifications.   Coronary Arteries:  Normal coronary origin.  Right dominance.   RCA is a dominant artery. There is no plaque.   Left main gives rise to LAD and LCX arteries. LM has no disease.   LAD has no plaque.   LCX is a non-dominant artery.  There is no plaque.   Other findings:   Normal pulmonary vein drainage into the left atrium.   Normal left atrial appendage without a thrombus.   Normal size of the pulmonary artery.   IMPRESSION: 1. Coronary calcium  score of 0. 2. Normal coronary origin with right dominance. 3. No evidence of CAD. 4. CAD-RADS 0. Consider non-atherosclerotic causes of chest pain. __________   2D echo 06/25/2023: 1. Left ventricular ejection fraction, by estimation, is 60  to 65%. The  left ventricle has normal function. The left ventricle has no regional  wall motion abnormalities. Left ventricular diastolic parameters are  consistent with Grade I diastolic  dysfunction (impaired relaxation).   2. Right ventricular systolic function is normal. The right ventricular  size is normal. Tricuspid regurgitation signal is inadequate for assessing  PA pressure.   3. Left atrial size was moderately dilated.   4. The mitral valve is normal in structure. No evidence of mitral valve  regurgitation. No evidence of mitral stenosis.   5. The aortic valve has an indeterminant number of cusps. Aortic valve  regurgitation is not visualized. No aortic stenosis is present.   6. The inferior vena cava is normal in size with greater than 50%  respiratory variability, suggesting right atrial pressure of 3 mmHg.    EKG:  EKG is ordered today.  The EKG ordered today demonstrates NSR, 67 bpm, left axis deviation, rare PVC, QRS, no acute ST-T changes  Recent Labs: 05/05/2023: ALT 22; BUN 13; Creatinine, Ser 0.78; Potassium 4.7; Sodium 146  Recent Lipid Panel    Component Value Date/Time   CHOL 123 05/05/2023 0903   TRIG 143 05/05/2023 0903   HDL 44 05/05/2023 0903   CHOLHDL 2.8 05/05/2023 0903   CHOLHDL 3.2 02/12/2022 0946   VLDL 32 02/12/2022 0946   LDLCALC 54 05/05/2023 0903   LDLDIRECT 58 05/05/2023 0903   LDLDIRECT 100 (H) 02/12/2022 0946    PHYSICAL EXAM:    VS:  BP 120/73 (BP Location: Left Arm, Patient Position: Sitting, Cuff Size: Normal)   Pulse 67 Comment: 67 oximeter  Ht 5' 6 (1.676 m)   Wt 222 lb 12.8 oz (101.1 kg)   SpO2 97%   BMI 35.96 kg/m   BMI: Body mass index is 35.96 kg/m.  Physical Exam Vitals reviewed.  Constitutional:      Appearance: She is well-developed.  HENT:  Head: Normocephalic and atraumatic.  Eyes:     General:        Right eye: No discharge.        Left eye: No discharge.  Cardiovascular:     Rate and Rhythm: Normal  rate and regular rhythm.     Heart sounds: Normal heart sounds, S1 normal and S2 normal. Heart sounds not distant. No midsystolic click and no opening snap. No murmur heard.    No friction rub.  Pulmonary:     Effort: Pulmonary effort is normal. No respiratory distress.     Breath sounds: Normal breath sounds. No decreased breath sounds, wheezing, rhonchi or rales.  Musculoskeletal:     Cervical back: Normal range of motion.     Right lower leg: No edema.     Left lower leg: No edema.  Skin:    General: Skin is warm and dry.     Nails: There is no clubbing.  Neurological:     Mental Status: She is alert and oriented to person, place, and time.  Psychiatric:        Speech: Speech normal.        Behavior: Behavior normal.        Thought Content: Thought content normal.        Judgment: Judgment normal.     Wt Readings from Last 3 Encounters:  01/22/24 222 lb 12.8 oz (101.1 kg)  07/08/23 237 lb (107.5 kg)  05/05/23 240 lb (108.9 kg)     ASSESSMENT & PLAN:   Nonobstructive CAD: She is doing well and without symptoms concerning for angina or cardiac decompensation.  Prior coronary CTA with a calcium  score of 0 with no evidence of CAD.  Continue aggressive risk factor modification and primary prevention including aspirin  81 mg and rosuvastatin  20 mg.  No indication for further ischemic testing at this time.  Lower extremity swelling: Improved.  Continue leg elevation, compression socks, and remains on low-dose furosemide 20 mg daily with labs stable.  HTN: Blood pressure is well-controlled in the office today.  However, she has noted some positional dizziness since her last visit in the setting of intentional weight loss and in this setting has been taking carvedilol  25 mg once daily rather than twice daily.  Given this, we will decrease her carvedilol  to 12.5 mg twice daily.  She remains on losartan  50 mg.  Recent labs stable.  HLD: LDL 54 in 04/2023.  She remains on rosuvastatin  20  mg.  Obesity with OSA: Her weight is down 15 pounds today when compared to her visit in 07/2023.  Continued weight loss is encouraged through heart healthy diet and regular exercise.  Intolerant to CPAP.     Disposition: F/u with Dr. Darliss or an APP in 6 months.   Medication Adjustments/Labs and Tests Ordered: Current medicines are reviewed at length with the patient today.  Concerns regarding medicines are outlined above. Medication changes, Labs and Tests ordered today are summarized above and listed in the Patient Instructions accessible in Encounters.   SignedBernardino Bring, PA-C 01/22/2024 1:06 PM     [facility] [place] [address] [place], [place] [address] [phone]     [1]  Current Meds  Medication Sig   aspirin  EC 81 MG tablet Take 81 mg by mouth daily. Swallow whole.   cholecalciferol  (VITAMIN D3) 25 MCG (1000 UNIT) tablet Take 1,000 Units by mouth at bedtime.   Coenzyme Q10 (CO Q 10 PO) Take  by mouth daily at 2 am.   famotidine  (PEPCID ) 20 MG tablet One after supper   furosemide (LASIX) 20 MG tablet Take 20 mg by mouth daily.   ibuprofen (ADVIL) 200 MG tablet Take 400 mg by mouth every 8 (eight) hours as needed (for pain.).   losartan  (COZAAR ) 50 MG tablet Take 1 tablet (50 mg total) by mouth daily.   Magnesium  200 MG TABS Take by mouth daily at 2 am.   Multiple Vitamin (MULTIVITAMIN) tablet Take 1 tablet by mouth daily.   pantoprazole  (PROTONIX ) 40 MG tablet TAKE 1 TABLET (40 MG TOTAL) BY MOUTH DAILY. TAKE 30-60 MIN BEFORE FIRST MEAL OF THE DAY   Riboflavin 25 MG TABS Take 50 mg by mouth daily at 2 am.   rosuvastatin  (CRESTOR ) 20 MG tablet TAKE 1 TABLET BY MOUTH EVERY DAY   sucralfate (CARAFATE) 1 g tablet Take 1 g by mouth 4 (four) times daily -  with meals and at bedtime.   TURMERIC PO Take by mouth daily in the afternoon.   [DISCONTINUED] carvedilol  (COREG ) 25 MG tablet TAKE 1 TABLET BY MOUTH TWICE A DAY   "

## 2024-01-22 ENCOUNTER — Encounter: Payer: Self-pay | Admitting: Physician Assistant

## 2024-01-22 ENCOUNTER — Ambulatory Visit: Attending: Physician Assistant | Admitting: Physician Assistant

## 2024-01-22 VITALS — BP 120/73 | HR 67 | Ht 66.0 in | Wt 222.8 lb

## 2024-01-22 DIAGNOSIS — Z6839 Body mass index (BMI) 39.0-39.9, adult: Secondary | ICD-10-CM

## 2024-01-22 DIAGNOSIS — I2510 Atherosclerotic heart disease of native coronary artery without angina pectoris: Secondary | ICD-10-CM | POA: Diagnosis not present

## 2024-01-22 DIAGNOSIS — Z6835 Body mass index (BMI) 35.0-35.9, adult: Secondary | ICD-10-CM

## 2024-01-22 DIAGNOSIS — M7989 Other specified soft tissue disorders: Secondary | ICD-10-CM | POA: Diagnosis not present

## 2024-01-22 DIAGNOSIS — E785 Hyperlipidemia, unspecified: Secondary | ICD-10-CM

## 2024-01-22 DIAGNOSIS — G4733 Obstructive sleep apnea (adult) (pediatric): Secondary | ICD-10-CM

## 2024-01-22 DIAGNOSIS — I10 Essential (primary) hypertension: Secondary | ICD-10-CM | POA: Diagnosis not present

## 2024-01-22 DIAGNOSIS — E66812 Obesity, class 2: Secondary | ICD-10-CM

## 2024-01-22 MED ORDER — CARVEDILOL 12.5 MG PO TABS: 12.5000 mg | ORAL_TABLET | Freq: Two times a day (BID) | ORAL | 3 refills | Status: AC

## 2024-01-22 NOTE — Patient Instructions (Signed)
 Medication Instructions:  Your physician recommends the following medication changes  DECREASE: Carvedilol  (COREG ) 12.5 mg twice daily  Continue all other medications as prescribed.   *If you need a refill on your cardiac medications before your next appointment, please call your pharmacy*  Lab Work: No labs ordered today  If you have labs (blood work) drawn today and your tests are completely normal, you will receive your results only by: MyChart Message (if you have MyChart) OR A paper copy in the mail If you have any lab test that is abnormal or we need to change your treatment, we will call you to review the results.  Testing/Procedures: No test ordered today   Follow-Up: At [facility], you and your health needs are our priority.  As part of our continuing mission to provide you with exceptional heart care, our providers are all part of one team.  This team includes your primary Cardiologist (physician) and Advanced Practice Providers or APPs (Physician Assistants and Nurse Practitioners) who all work together to provide you with the care you need, when you need it.  Your next appointment:   6 month(s)  Provider:   You may see Redell Cave, MD or one of the following Advanced Practice Providers on your designated Care Team:    Bernardino Bring, PA-C  We recommend signing up for the patient portal called MyChart.  Sign up information is provided on this After Visit Summary.  MyChart is used to connect with patients for Virtual Visits (Telemedicine).  Patients are able to view lab/test results, encounter notes, upcoming appointments, etc.  Non-urgent messages can be sent to your provider as well.   To learn more about what you can do with MyChart, go to [email or web address].

## 2024-02-10 ENCOUNTER — Telehealth: Payer: Self-pay | Admitting: Physician Assistant

## 2024-02-10 NOTE — Telephone Encounter (Signed)
 Pt asking if there is anyway we can send her a copy of her insurance card on file. Please advise.

## 2024-07-05 ENCOUNTER — Ambulatory Visit: Admitting: Physician Assistant
